# Patient Record
Sex: Male | Born: 1960 | Hispanic: No | Marital: Married | Smoking: Never smoker
Health system: Southern US, Community
[De-identification: ages and names within clinical notes are randomized; demographics above are authoritative.]

## PROBLEM LIST (undated history)

## (undated) DIAGNOSIS — N281 Cyst of kidney, acquired: Secondary | ICD-10-CM

## (undated) DIAGNOSIS — N3289 Other specified disorders of bladder: Secondary | ICD-10-CM

## (undated) DIAGNOSIS — N4289 Other specified disorders of prostate: Secondary | ICD-10-CM

## (undated) DIAGNOSIS — K760 Fatty (change of) liver, not elsewhere classified: Secondary | ICD-10-CM

## (undated) DIAGNOSIS — S199XXA Unspecified injury of neck, initial encounter: Secondary | ICD-10-CM

## (undated) DIAGNOSIS — Z87448 Personal history of other diseases of urinary system: Secondary | ICD-10-CM

---

## 1997-08-06 ENCOUNTER — Emergency Department (HOSPITAL_COMMUNITY): Admission: EM | Admit: 1997-08-06 | Discharge: 1997-08-06 | Payer: Self-pay | Admitting: Emergency Medicine

## 1997-08-10 ENCOUNTER — Encounter: Admission: RE | Admit: 1997-08-10 | Discharge: 1997-08-10 | Payer: Self-pay | Admitting: Hematology and Oncology

## 1997-08-20 ENCOUNTER — Emergency Department (HOSPITAL_COMMUNITY): Admission: EM | Admit: 1997-08-20 | Discharge: 1997-08-20 | Payer: Self-pay | Admitting: Emergency Medicine

## 1997-11-04 ENCOUNTER — Ambulatory Visit (HOSPITAL_COMMUNITY): Admission: RE | Admit: 1997-11-04 | Discharge: 1997-11-04 | Payer: Self-pay | Admitting: Family Medicine

## 1997-11-04 ENCOUNTER — Encounter: Payer: Self-pay | Admitting: Family Medicine

## 1997-11-29 ENCOUNTER — Ambulatory Visit (HOSPITAL_COMMUNITY): Admission: RE | Admit: 1997-11-29 | Discharge: 1997-11-29 | Payer: Self-pay | Admitting: Internal Medicine

## 1998-02-26 DIAGNOSIS — S199XXA Unspecified injury of neck, initial encounter: Secondary | ICD-10-CM

## 1998-02-26 HISTORY — DX: Unspecified injury of neck, initial encounter: S19.9XXA

## 1999-02-27 DIAGNOSIS — Z87448 Personal history of other diseases of urinary system: Secondary | ICD-10-CM

## 1999-02-27 HISTORY — DX: Personal history of other diseases of urinary system: Z87.448

## 1999-06-06 ENCOUNTER — Encounter: Payer: Self-pay | Admitting: Family Medicine

## 1999-06-06 ENCOUNTER — Encounter: Admission: RE | Admit: 1999-06-06 | Discharge: 1999-06-06 | Payer: Self-pay | Admitting: Family Medicine

## 1999-06-22 ENCOUNTER — Encounter: Admission: RE | Admit: 1999-06-22 | Discharge: 1999-06-22 | Payer: Self-pay | Admitting: Urology

## 1999-06-22 ENCOUNTER — Encounter: Payer: Self-pay | Admitting: Urology

## 2000-02-15 ENCOUNTER — Other Ambulatory Visit: Admission: RE | Admit: 2000-02-15 | Discharge: 2000-02-15 | Payer: Self-pay | Admitting: Urology

## 2000-09-11 ENCOUNTER — Other Ambulatory Visit: Admission: RE | Admit: 2000-09-11 | Discharge: 2000-09-11 | Payer: Self-pay | Admitting: Urology

## 2001-03-04 ENCOUNTER — Encounter: Payer: Self-pay | Admitting: Emergency Medicine

## 2001-03-04 ENCOUNTER — Emergency Department (HOSPITAL_COMMUNITY): Admission: EM | Admit: 2001-03-04 | Discharge: 2001-03-04 | Payer: Self-pay | Admitting: Emergency Medicine

## 2001-04-09 ENCOUNTER — Emergency Department (HOSPITAL_COMMUNITY): Admission: EM | Admit: 2001-04-09 | Discharge: 2001-04-09 | Payer: Self-pay | Admitting: Emergency Medicine

## 2003-02-27 HISTORY — PX: NECK SURGERY: SHX720

## 2007-05-13 ENCOUNTER — Emergency Department (HOSPITAL_COMMUNITY): Admission: EM | Admit: 2007-05-13 | Discharge: 2007-05-13 | Payer: Self-pay | Admitting: Emergency Medicine

## 2009-05-30 ENCOUNTER — Emergency Department (HOSPITAL_COMMUNITY): Admission: EM | Admit: 2009-05-30 | Discharge: 2009-05-31 | Payer: Self-pay | Admitting: Emergency Medicine

## 2010-08-22 ENCOUNTER — Emergency Department (HOSPITAL_COMMUNITY)
Admission: EM | Admit: 2010-08-22 | Discharge: 2010-08-22 | Disposition: A | Discharge status: Home / Self Care | Payer: 59 | Attending: Emergency Medicine | Admitting: Emergency Medicine

## 2010-08-22 DIAGNOSIS — T622X1A Toxic effect of other ingested (parts of) plant(s), accidental (unintentional), initial encounter: Secondary | ICD-10-CM | POA: Insufficient documentation

## 2010-08-22 DIAGNOSIS — L255 Unspecified contact dermatitis due to plants, except food: Secondary | ICD-10-CM | POA: Insufficient documentation

## 2010-08-22 DIAGNOSIS — L299 Pruritus, unspecified: Secondary | ICD-10-CM | POA: Insufficient documentation

## 2011-05-25 ENCOUNTER — Emergency Department (INDEPENDENT_AMBULATORY_CARE_PROVIDER_SITE_OTHER): Payer: Worker's Compensation

## 2011-05-25 ENCOUNTER — Emergency Department (HOSPITAL_BASED_OUTPATIENT_CLINIC_OR_DEPARTMENT_OTHER)
Admission: EM | Admit: 2011-05-25 | Discharge: 2011-05-25 | Disposition: A | Discharge status: Home / Self Care | Payer: Worker's Compensation | Attending: Emergency Medicine | Admitting: Emergency Medicine

## 2011-05-25 ENCOUNTER — Encounter (HOSPITAL_BASED_OUTPATIENT_CLINIC_OR_DEPARTMENT_OTHER): Payer: Self-pay | Admitting: *Deleted

## 2011-05-25 DIAGNOSIS — R209 Unspecified disturbances of skin sensation: Secondary | ICD-10-CM | POA: Insufficient documentation

## 2011-05-25 DIAGNOSIS — W208XXA Other cause of strike by thrown, projected or falling object, initial encounter: Secondary | ICD-10-CM | POA: Insufficient documentation

## 2011-05-25 DIAGNOSIS — S99929A Unspecified injury of unspecified foot, initial encounter: Secondary | ICD-10-CM | POA: Insufficient documentation

## 2011-05-25 DIAGNOSIS — S8012XA Contusion of left lower leg, initial encounter: Secondary | ICD-10-CM

## 2011-05-25 DIAGNOSIS — S8990XA Unspecified injury of unspecified lower leg, initial encounter: Secondary | ICD-10-CM | POA: Insufficient documentation

## 2011-05-25 DIAGNOSIS — S8010XA Contusion of unspecified lower leg, initial encounter: Secondary | ICD-10-CM | POA: Insufficient documentation

## 2011-05-25 DIAGNOSIS — M79609 Pain in unspecified limb: Secondary | ICD-10-CM

## 2011-05-25 MED ORDER — NAPROXEN 500 MG PO TABS: 500.0000 mg | ORAL_TABLET | Freq: Two times a day (BID) | ORAL | Status: AC

## 2011-05-25 MED ORDER — NAPROXEN 250 MG PO TABS
500.0000 mg | ORAL_TABLET | Freq: Once | ORAL | Status: AC
  Administered 2011-05-25: 500 mg via ORAL
  Filled 2011-05-25: qty 2

## 2011-05-25 NOTE — ED Notes (Signed)
Pt says he was working and some boxes (approx 10 boxes weighing approx 25lbs a piece fell from about 5 feet) fell on his left leg. He c/o pain in his left calf area

## 2011-05-25 NOTE — ED Provider Notes (Signed)
History     CSN: [id number]  Arrival date & time 05/25/11  0053   First MD Initiated Contact with Patient 05/25/11 0137      No chief complaint on file.   (Consider location/radiation/quality/duration/timing/severity/associated sxs/prior treatment) HPI Comments: 51 year old male presents after having acute onset of injury to his left lower leg when a pile of 25 pound boxes fell onto his lower leg off of a palate. The symptoms were acute in onset, persistent, worse with palpation or ambulation and associated with mild tingling of the left foot. He denies bruising, swelling or bleeding at the injury site. He has no other injury  The history is provided by the patient.    History reviewed. No pertinent past medical history.  History reviewed. No pertinent past surgical history.  No family history on file.  History  Substance Use Topics  . Smoking status: Never Smoker   . Smokeless tobacco: Not on file  . Alcohol Use: No      Review of Systems  Musculoskeletal: Negative for joint swelling.  Skin: Negative for wound.  Neurological: Negative for numbness.       Tingling    Allergies  Review of patient's allergies indicates no known allergies.  Home Medications   Current Outpatient Rx  Name Route Sig Dispense Refill  . NAPROXEN 500 MG PO TABS Oral Take 1 tablet (500 mg total) by mouth 2 (two) times daily with a meal. 30 tablet 0    BP 124/77  Pulse 84  Temp(Src) 97.7 F (36.5 C) (Oral)  Resp 20  SpO2 98%  Physical Exam  Nursing note and vitals reviewed. Constitutional: He appears well-developed and well-nourished. No distress.  HENT:  Head: Normocephalic and atraumatic.  Eyes: Conjunctivae are normal. No scleral icterus.  Cardiovascular: Normal rate, regular rhythm and intact distal pulses.   Pulmonary/Chest: Effort normal and breath sounds normal.  Musculoskeletal: He exhibits tenderness ( Over the mid lateral lower extremity below the knee and above the  ankle on the left.). He exhibits no edema.  Neurological: He is alert.       Normal motor and sensation of the left lower extremity  Skin: Skin is warm and dry. No rash noted. He is not diaphoretic.    ED Course  Procedures (including critical care time)  Labs Reviewed - No data to display Dg Tibia/fibula Left  05/25/2011  *RADIOLOGY REPORT*  Clinical Data: Boxes fell on left lower leg, with lateral lower leg pain.  LEFT TIBIA AND FIBULA - 2 VIEW  Comparison: None.  Findings: The tibia and fibula appear grossly intact.  There is no evidence of fracture or dislocation.  The knee joint is grossly unremarkable in appearance; no knee joint effusion is identified. The ankle mortise is incompletely assessed, but appears grossly unremarkable.  No significant soft tissue abnormalities are characterized on radiograph.  IMPRESSION: No evidence of fracture or dislocation.  Original Report Authenticated By: Tonia Ghent, M.D.     1. Contusion of left lower leg       MDM  No bruising or obvious deformity, likely contusion, x-ray to rule out fracture, Naprosyn for pain, ice and elevation. No other significant injuries evident on exam.  Imaging negative for fracture, I have personally reviewed the x-rays and find no injuries of the bones or underlying soft tissues. Patient stable for discharge, Naprosyn given prior to discharge  Discharge Prescriptions include:  Naprosyn        Vida Roller, MD 05/25/11 [phone]

## 2011-05-25 NOTE — Discharge Instructions (Signed)
See your doctor as needed - your xray is normal.  You may continue to have pain for the next week but should gradually improve.  RESOURCE GUIDE  Dental Problems  Patients with Medicaid: [facility] [phone] [address]                                           [phone] [address] Phone:  [phone]                                                  Phone:  [phone]  If unable to pay or uninsured, contact:  [facility] or [facility]. to become qualified for the adult dental clinic.  Chronic Pain Problems Contact [facility]  [phone] Patients need to be referred by their primary care doctor.  Insufficient Money for Medicine Contact [employer]:  call "211" or [facility] ([phone].  No Primary Care Doctor Call [facility]  [phone] Other agencies that provide inexpensive medical care    [facility]  [phone]    [facility]  [phone]    [facility]  [phone]    [facility]  [phone]    [facility]  [phone]    [facility]  [phone]  Psychological Services [facility] Behavioral Health  [phone] [facility] Services  [phone] [facility] Mental [facility]   [phone] (emergency services [phone])  Substance Abuse Resources [facility]  [phone] [facility] 7313724670 [facility] [place] [phone] Floydene Flock [phone] Residential & Outpatient Substance Abuse Program  [phone]  Abuse/Neglect [facility] Child Abuse Hotline 325 678 5652 [facility] Child Abuse Hotline 346-874-4249 (After Hours)  Emergency Shelter [facility] [phone]  Maternity Homes [facility] at [facility] [place] [facility] [place] [phone] [facility] [phone]  MRSA Hotline #:   551 392 8808    [facility] Resources  [facility] [place]      [employer]                          [facility] Dept. [address] [place]                       [address]      [address]  [place]                                                Cristobal Goldmann Phone:  [phone]                                   Phone:  [phone]  Phone:  [phone]  [facility] Mental Health Phone:  [phone]  [facility] Child Abuse Hotline [phone] (After Hours)

## 2017-05-26 ENCOUNTER — Emergency Department (HOSPITAL_COMMUNITY)
Admission: EM | Admit: 2017-05-26 | Discharge: 2017-05-26 | Disposition: A | Discharge status: Home / Self Care | Payer: 59 | Attending: Emergency Medicine | Admitting: Emergency Medicine

## 2017-05-26 ENCOUNTER — Other Ambulatory Visit: Payer: Self-pay

## 2017-05-26 ENCOUNTER — Encounter (HOSPITAL_COMMUNITY): Payer: Self-pay | Admitting: *Deleted

## 2017-05-26 ENCOUNTER — Emergency Department (HOSPITAL_COMMUNITY): Payer: 59

## 2017-05-26 DIAGNOSIS — N429 Disorder of prostate, unspecified: Secondary | ICD-10-CM | POA: Diagnosis not present

## 2017-05-26 DIAGNOSIS — N3289 Other specified disorders of bladder: Secondary | ICD-10-CM

## 2017-05-26 DIAGNOSIS — R319 Hematuria, unspecified: Secondary | ICD-10-CM

## 2017-05-26 DIAGNOSIS — N4289 Other specified disorders of prostate: Secondary | ICD-10-CM

## 2017-05-26 DIAGNOSIS — K760 Fatty (change of) liver, not elsewhere classified: Secondary | ICD-10-CM

## 2017-05-26 DIAGNOSIS — N281 Cyst of kidney, acquired: Secondary | ICD-10-CM

## 2017-05-26 DIAGNOSIS — R109 Unspecified abdominal pain: Secondary | ICD-10-CM | POA: Diagnosis present

## 2017-05-26 HISTORY — DX: Cyst of kidney, acquired: N28.1

## 2017-05-26 HISTORY — DX: Other specified disorders of prostate: N42.89

## 2017-05-26 HISTORY — DX: Fatty (change of) liver, not elsewhere classified: K76.0

## 2017-05-26 HISTORY — DX: Other specified disorders of bladder: N32.89

## 2017-05-26 LAB — URINALYSIS, ROUTINE W REFLEX MICROSCOPIC
BACTERIA UA: NONE SEEN
Bilirubin Urine: NEGATIVE
GLUCOSE, UA: NEGATIVE mg/dL
Ketones, ur: NEGATIVE mg/dL
Leukocytes, UA: NEGATIVE
Nitrite: NEGATIVE
PH: 6 (ref 5.0–8.0)
Protein, ur: NEGATIVE mg/dL
SPECIFIC GRAVITY, URINE: 1.009 (ref 1.005–1.030)
SQUAMOUS EPITHELIAL / LPF: NONE SEEN

## 2017-05-26 LAB — COMPREHENSIVE METABOLIC PANEL
ALBUMIN: 3.5 g/dL (ref 3.5–5.0)
ALT: 48 U/L (ref 17–63)
AST: 42 U/L — ABNORMAL HIGH (ref 15–41)
Alkaline Phosphatase: 84 U/L (ref 38–126)
Anion gap: 7 (ref 5–15)
BILIRUBIN TOTAL: 1.5 mg/dL — AB (ref 0.3–1.2)
BUN: 16 mg/dL (ref 6–20)
CO2: 28 mmol/L (ref 22–32)
CREATININE: 0.78 mg/dL (ref 0.61–1.24)
Calcium: 9.2 mg/dL (ref 8.9–10.3)
Chloride: 105 mmol/L (ref 101–111)
GFR calc Af Amer: >60 mL/min (ref >60)
GLUCOSE: 87 mg/dL (ref 65–99)
POTASSIUM: 4.2 mmol/L (ref 3.5–5.1)
Sodium: 140 mmol/L (ref 135–145)
TOTAL PROTEIN: 6.5 g/dL (ref 6.5–8.1)

## 2017-05-26 LAB — CBC
HEMATOCRIT: 44.5 % (ref 39.0–52.0)
Hemoglobin: 14.6 g/dL (ref 13.0–17.0)
MCH: 29.4 pg (ref 26.0–34.0)
MCHC: 32.8 g/dL (ref 30.0–36.0)
MCV: 89.5 fL (ref 78.0–100.0)
PLATELETS: 287 10*3/uL (ref 150–400)
RBC: 4.97 MIL/uL (ref 4.22–5.81)
RDW: 13.1 % (ref 11.5–15.5)
WBC: 6 10*3/uL (ref 4.0–10.5)

## 2017-05-26 LAB — LIPASE, BLOOD: Lipase: 36 U/L (ref 11–51)

## 2017-05-26 MED ORDER — IOPAMIDOL (ISOVUE-300) INJECTION 61%
INTRAVENOUS | Status: AC
  Administered 2017-05-26: 100 mL via INTRAVENOUS
  Filled 2017-05-26: qty 100

## 2017-05-26 NOTE — ED Triage Notes (Signed)
Pt is here with complaints of right flank pain that radiates around to mid abdomen for the last couple of days.  Pt complains on numbness to whole right side of his body for a long time. Pt denies NVD

## 2017-05-26 NOTE — ED Notes (Signed)
Called Pt twice, no answer from lobby area

## 2017-05-26 NOTE — Discharge Instructions (Addendum)
Your Ct scan shows a mass in your bladder and in your prostate.   Your urine shows blood.  You need to be seen by urology for complete evaluation

## 2017-05-26 NOTE — ED Notes (Signed)
Called pt's name for a room, no answer. Nurse notified.

## 2017-05-26 NOTE — ED Provider Notes (Signed)
[facility] EMERGENCY DEPARTMENT Provider Note   CSN: [id number] Arrival date & time: 05/26/17  1314     History   Chief Complaint Chief Complaint  Patient presents with  . Abdominal Pain  . Flank Pain    HPI Jeffrey Hurley is a 57 y.o. male.  The history is provided by the patient.  Abdominal Pain   This is a new problem. The current episode started more than 1 week ago. The problem occurs constantly. The problem has been gradually worsening. The pain is associated with an unknown factor. The pain is moderate. Pertinent negatives include anorexia and fever. Nothing aggravates the symptoms. Nothing relieves the symptoms.  Flank Pain  Associated symptoms include abdominal pain.   Pt complains of lower abdominal pain  History reviewed. No pertinent past medical history.  There are no active problems to display for this patient.   History reviewed. No pertinent surgical history.      Home Medications    Prior to Admission medications   Not on File    Family History No family history on file.  Social History Social History   Tobacco Use  . Smoking status: Never Smoker  . Smokeless tobacco: Never Used  Substance Use Topics  . Alcohol use: No  . Drug use: No     Allergies   Patient has no known allergies.   Review of Systems Review of Systems  Constitutional: Negative for fever.  Gastrointestinal: Positive for abdominal pain. Negative for anorexia.  Genitourinary: Positive for flank pain.  All other systems reviewed and are negative.    Physical Exam Updated Vital Signs BP 136/75 (BP Location: Right Arm)   Pulse 84   Temp 98.1 F (36.7 C) (Oral)   Resp 18   SpO2 99%   Physical Exam  Constitutional: He appears well-developed and well-nourished.  HENT:  Head: Normocephalic and atraumatic.  Mouth/Throat: Oropharynx is clear and moist.  Eyes: Pupils are equal, round, and reactive to light. Conjunctivae and EOM are normal.    Neck: Neck supple.  Cardiovascular: Normal rate and regular rhythm.  No murmur heard. Pulmonary/Chest: Effort normal and breath sounds normal. No respiratory distress.  Abdominal: Soft. Bowel sounds are normal. There is tenderness in the suprapubic area.  Musculoskeletal: He exhibits no edema.  Neurological: He is alert.  Skin: Skin is warm and dry.  Psychiatric: He has a normal mood and affect.  Nursing note and vitals reviewed.    ED Treatments / Results  Labs (all labs ordered are listed, but only abnormal results are displayed) Labs Reviewed  COMPREHENSIVE METABOLIC PANEL - Abnormal; Notable for the following components:      Result Value   AST 42 (*)    Total Bilirubin 1.5 (*)    All other components within normal limits  URINALYSIS, ROUTINE W REFLEX MICROSCOPIC - Abnormal; Notable for the following components:   Color, Urine STRAW (*)    Hgb urine dipstick MODERATE (*)    All other components within normal limits  LIPASE, BLOOD  CBC    EKG None  Radiology Ct Abdomen Pelvis W Contrast  Result Date: 05/26/2017 CLINICAL DATA:  Right flank pain radiating to the mid abdomen for the past few days. EXAM: CT ABDOMEN AND PELVIS WITH CONTRAST TECHNIQUE: Multidetector CT imaging of the abdomen and pelvis was performed using the standard protocol following bolus administration of intravenous contrast. CONTRAST:  156mL ISOVUE-300 IOPAMIDOL (ISOVUE-300) INJECTION 61% COMPARISON:  None. FINDINGS: Lower chest: Normal size heart  without pericardial effusion. There is atelectasis and/or scarring at the lung bases, right greater than left. Hepatobiliary: There is hepatic steatosis. Gallbladder is unremarkable. No biliary dilatation is noted. Pancreas: No ductal dilatation, inflammation or enhancing mass. Spleen: Normal Adrenals/Urinary Tract: Normal bilateral adrenal glands. Simple appearing cyst in the upper pole of the right kidney measuring 1 cm. Additional 6 mm cyst in the interpolar  right kidney is also noted. No nephrolithiasis nor obstructive uropathy. A 9 mm intraluminal rounded mass along the dependent wall of the bladder is visualized, series 3/66 and series 7/49 is noted for which direct visual correlation is recommended. This could represent a small bladder polyp, a mucosal/transitional cell mass, or intraluminal clot among some possibilities though not exclusive. Stomach/Bowel: Small hiatal hernia. Decompressed stomach with normal small bowel rotation. Moderate colonic stool burden without inflammation. Appendix appears normal. Vascular/Lymphatic: No significant vascular findings are present. No enlarged abdominal or pelvic lymph nodes. Reproductive: Approximately 2 cm rounded focus with peripheral enhancement and central hypodensity is noted within the anterior right side of the prostate. No pelvic sidewall involvement is identified. Other: No abdominal wall hernia or abnormality. No abdominopelvic ascites. Musculoskeletal: Moderate disc flattening at L3-4. IMPRESSION: 1. Intraluminal 9 mm mass along the dependent wall bladder which direct visual correlation is recommended. 2. Peripherally enhancing 2 cm intramural focus within the prostate also needs clinical correlation. From an imaging standpoint, non emergent prostate MRI may help for further evaluation. 3. Hepatic steatosis. 4. Moderate disc flattening L3-4. 5. Simple appearing cysts in the right kidney, the largest measuring 1 cm. Electronically Signed   By: Ashley Royalty M.D.   On: 05/26/2017 20:56    Procedures Procedures (including critical care time)  Medications Ordered in ED Medications  iopamidol (ISOVUE-300) 61 % injection (100 mLs Intravenous Contrast Given 05/26/17 1900)     Initial Impression / Assessment and Plan / ED Course  I have reviewed the triage vital signs and the nursing notes.  Pertinent labs & imaging results that were available during my care of the patient were reviewed by me and considered in  my medical decision making (see chart for details).     MDM  Pt counseled on results of ct scan.  Pt advised he needs to schedule to see the Urologist for evalaution of prostate and bladder abnormality.  [facility] to see [facility] for evaluation.  Pt is advised to call tomorrow for appointment   Final Clinical Impressions(s) / ED Diagnoses   Final diagnoses:  Abdominal pain, unspecified abdominal location  Bladder mass  Hematuria, unspecified type  Prostate mass    ED Discharge Orders    None    An After Visit Summary was printed and given to the patient.    Sidney Ace 05/26/17 2137    Valarie Merino, MD 05/26/17 [phone]

## 2017-11-04 ENCOUNTER — Other Ambulatory Visit: Payer: Self-pay | Admitting: Urology

## 2017-11-04 ENCOUNTER — Encounter (HOSPITAL_BASED_OUTPATIENT_CLINIC_OR_DEPARTMENT_OTHER): Payer: Self-pay

## 2017-11-05 ENCOUNTER — Encounter (HOSPITAL_BASED_OUTPATIENT_CLINIC_OR_DEPARTMENT_OTHER): Payer: Self-pay

## 2017-11-05 ENCOUNTER — Other Ambulatory Visit: Payer: Self-pay

## 2017-11-05 MED ORDER — GENTAMICIN SULFATE 40 MG/ML IJ SOLN
5.0000 mg/kg | INTRAVENOUS | Status: AC
  Administered 2017-11-06: 330 mg via INTRAVENOUS
  Filled 2017-11-05 (×2): qty 8.25

## 2017-11-05 NOTE — Anesthesia Preprocedure Evaluation (Addendum)
Anesthesia Evaluation  Patient identified by MRN, date of birth, ID band Patient awake    Reviewed: Allergy & Precautions, NPO status , Patient's Chart, lab work & pertinent test results  Airway Mallampati: II  TM Distance: >3 FB     Dental  (+) Dental Advisory Given, Teeth Intact, Missing,    Pulmonary neg pulmonary ROS,    breath sounds clear to auscultation       Cardiovascular negative cardio ROS   Rhythm:Regular Rate:Normal     Neuro/Psych negative neurological ROS     GI/Hepatic negative GI ROS, Neg liver ROS,   Endo/Other  negative endocrine ROS  Renal/GU negative Renal ROSBladder Tumor     Musculoskeletal   Abdominal   Peds  Hematology negative hematology ROS (+)   Anesthesia Other Findings   Reproductive/Obstetrics                          Lab Results  Component Value Date   WBC 6.0 05/26/2017   HGB 14.6 05/26/2017   HCT 44.5 05/26/2017   MCV 89.5 05/26/2017   PLT 287 05/26/2017   Lab Results  Component Value Date   CREATININE 0.78 05/26/2017   BUN 16 05/26/2017   NA 140 05/26/2017   K 4.2 05/26/2017   CL 105 05/26/2017   CO2 28 05/26/2017    Anesthesia Physical Anesthesia Plan  ASA: II  Anesthesia Plan: General   Post-op Pain Management:    Induction: Intravenous  PONV Risk Score and Plan: 2 and Dexamethasone and Ondansetron  Airway Management Planned: LMA and Oral ETT  Additional Equipment:   Intra-op Plan:   Post-operative Plan: Extubation in OR  Informed Consent: I have reviewed the patients History and Physical, chart, labs and discussed the procedure including the risks, benefits and alternatives for the proposed anesthesia with the patient or authorized representative who has indicated his/her understanding and acceptance.   Dental advisory given  Plan Discussed with: CRNA  Anesthesia Plan Comments:        Anesthesia Quick Evaluation

## 2017-11-05 NOTE — Progress Notes (Signed)
Spoke with:  Jeneen Rinks NPO:  After Midnight, no gum, candy, or mints  Arrival time:0800AM Labs: Istat 8 AM medications: None Pre op orders: Yes Ride home:  Donata Clay (wife) [phone]

## 2017-11-06 ENCOUNTER — Ambulatory Visit (HOSPITAL_BASED_OUTPATIENT_CLINIC_OR_DEPARTMENT_OTHER): Payer: 59 | Admitting: Anesthesiology

## 2017-11-06 ENCOUNTER — Encounter (HOSPITAL_BASED_OUTPATIENT_CLINIC_OR_DEPARTMENT_OTHER): Payer: Self-pay

## 2017-11-06 ENCOUNTER — Encounter (HOSPITAL_BASED_OUTPATIENT_CLINIC_OR_DEPARTMENT_OTHER)
Admission: RE | Disposition: A | Discharge status: Home / Self Care | Payer: Self-pay | Source: Ambulatory Visit | Attending: Urology

## 2017-11-06 ENCOUNTER — Ambulatory Visit (HOSPITAL_BASED_OUTPATIENT_CLINIC_OR_DEPARTMENT_OTHER)
Admission: RE | Admit: 2017-11-06 | Discharge: 2017-11-06 | Disposition: A | Discharge status: Home / Self Care | Payer: 59 | Source: Ambulatory Visit | Attending: Urology | Admitting: Urology

## 2017-11-06 DIAGNOSIS — N329 Bladder disorder, unspecified: Secondary | ICD-10-CM | POA: Diagnosis present

## 2017-11-06 DIAGNOSIS — D414 Neoplasm of uncertain behavior of bladder: Secondary | ICD-10-CM | POA: Insufficient documentation

## 2017-11-06 DIAGNOSIS — K760 Fatty (change of) liver, not elsewhere classified: Secondary | ICD-10-CM | POA: Diagnosis not present

## 2017-11-06 HISTORY — DX: Other specified disorders of prostate: N42.89

## 2017-11-06 HISTORY — DX: Personal history of other diseases of urinary system: Z87.448

## 2017-11-06 HISTORY — PX: CYSTOSCOPY W/ RETROGRADES: SHX1426

## 2017-11-06 HISTORY — DX: Cyst of kidney, acquired: N28.1

## 2017-11-06 HISTORY — DX: Unspecified injury of neck, initial encounter: S19.9XXA

## 2017-11-06 HISTORY — PX: TRANSURETHRAL RESECTION OF BLADDER TUMOR: SHX2575

## 2017-11-06 HISTORY — DX: Fatty (change of) liver, not elsewhere classified: K76.0

## 2017-11-06 HISTORY — DX: Other specified disorders of bladder: N32.89

## 2017-11-06 LAB — POCT I-STAT, CHEM 8
BUN: 12 mg/dL (ref 6–20)
CHLORIDE: 103 mmol/L (ref 98–111)
Calcium, Ion: 1.22 mmol/L (ref 1.15–1.40)
Creatinine, Ser: 0.8 mg/dL (ref 0.61–1.24)
Glucose, Bld: 86 mg/dL (ref 70–99)
HEMATOCRIT: 46 % (ref 39.0–52.0)
HEMOGLOBIN: 15.6 g/dL (ref 13.0–17.0)
Potassium: 3.9 mmol/L (ref 3.5–5.1)
SODIUM: 141 mmol/L (ref 135–145)
TCO2: 27 mmol/L (ref 22–32)

## 2017-11-06 SURGERY — TURBT (TRANSURETHRAL RESECTION OF BLADDER TUMOR)
Anesthesia: General | Site: Renal

## 2017-11-06 MED ORDER — LIDOCAINE 2% (20 MG/ML) 5 ML SYRINGE
INTRAMUSCULAR | Status: DC | PRN
  Administered 2017-11-06: 60 mg via INTRAVENOUS

## 2017-11-06 MED ORDER — TRAMADOL HCL 50 MG PO TABS
50.0000 mg | ORAL_TABLET | Freq: Once | ORAL | Status: AC
  Administered 2017-11-06: 50 mg via ORAL
  Filled 2017-11-06: qty 1

## 2017-11-06 MED ORDER — SENNOSIDES-DOCUSATE SODIUM 8.6-50 MG PO TABS: 1.0000 | ORAL_TABLET | Freq: Two times a day (BID) | ORAL | 0 refills | Status: DC

## 2017-11-06 MED ORDER — MIDAZOLAM HCL 2 MG/2ML IJ SOLN
INTRAMUSCULAR | Status: AC
  Filled 2017-11-06: qty 2

## 2017-11-06 MED ORDER — FENTANYL CITRATE (PF) 100 MCG/2ML IJ SOLN
INTRAMUSCULAR | Status: AC
  Filled 2017-11-06: qty 4

## 2017-11-06 MED ORDER — ONDANSETRON HCL 4 MG/2ML IJ SOLN
INTRAMUSCULAR | Status: DC | PRN
  Administered 2017-11-06: 4 mg via INTRAVENOUS

## 2017-11-06 MED ORDER — PROPOFOL 10 MG/ML IV BOLUS
INTRAVENOUS | Status: AC
  Filled 2017-11-06: qty 40

## 2017-11-06 MED ORDER — MIDAZOLAM HCL 2 MG/2ML IJ SOLN
INTRAMUSCULAR | Status: DC | PRN
  Administered 2017-11-06: 1 mg via INTRAVENOUS

## 2017-11-06 MED ORDER — PROPOFOL 10 MG/ML IV BOLUS
INTRAVENOUS | Status: DC | PRN
  Administered 2017-11-06: 150 mg via INTRAVENOUS

## 2017-11-06 MED ORDER — DEXAMETHASONE SODIUM PHOSPHATE 10 MG/ML IJ SOLN
INTRAMUSCULAR | Status: DC | PRN
  Administered 2017-11-06: 10 mg via INTRAVENOUS

## 2017-11-06 MED ORDER — SODIUM CHLORIDE 0.9 % IR SOLN
Status: DC | PRN
  Administered 2017-11-06: 3000 mL

## 2017-11-06 MED ORDER — FENTANYL CITRATE (PF) 100 MCG/2ML IJ SOLN
25.0000 ug | INTRAMUSCULAR | Status: DC | PRN
  Filled 2017-11-06: qty 1

## 2017-11-06 MED ORDER — FENTANYL CITRATE (PF) 100 MCG/2ML IJ SOLN
INTRAMUSCULAR | Status: DC | PRN
  Administered 2017-11-06: 25 ug via INTRAVENOUS

## 2017-11-06 MED ORDER — PROMETHAZINE HCL 25 MG/ML IJ SOLN
6.2500 mg | INTRAMUSCULAR | Status: DC | PRN
  Filled 2017-11-06: qty 1

## 2017-11-06 MED ORDER — FLUMAZENIL 0.5 MG/5ML IV SOLN
INTRAVENOUS | Status: DC | PRN
  Administered 2017-11-06: 0.2 mg via INTRAVENOUS

## 2017-11-06 MED ORDER — TRAMADOL HCL 50 MG PO TABS
ORAL_TABLET | ORAL | Status: AC
  Filled 2017-11-06: qty 1

## 2017-11-06 MED ORDER — TRAMADOL HCL 50 MG PO TABS: 50.0000 mg | ORAL_TABLET | Freq: Four times a day (QID) | ORAL | 0 refills | Status: AC | PRN

## 2017-11-06 MED ORDER — IOHEXOL 300 MG/ML  SOLN
INTRAMUSCULAR | Status: DC | PRN
  Administered 2017-11-06: 18 mL

## 2017-11-06 MED ORDER — LACTATED RINGERS IV SOLN
INTRAVENOUS | Status: DC
  Administered 2017-11-06: 09:00:00 via INTRAVENOUS
  Filled 2017-11-06: qty 1000

## 2017-11-06 MED ORDER — FLUMAZENIL 0.5 MG/5ML IV SOLN
INTRAVENOUS | Status: AC
  Filled 2017-11-06: qty 5

## 2017-11-06 SURGICAL SUPPLY — 30 items
BAG DRAIN URO-CYSTO SKYTR STRL (DRAIN) ×3 IMPLANT
BAG DRN ANRFLXCHMBR STRAP LEK (BAG)
BAG DRN UROCATH (DRAIN) ×2
BAG URINE DRAINAGE (UROLOGICAL SUPPLIES) IMPLANT
BAG URINE LEG 19OZ MD ST LTX (BAG) IMPLANT
BAG URINE LEG 500ML (DRAIN) IMPLANT
CATH FOLEY 2WAY SLVR  5CC 22FR (CATHETERS)
CATH FOLEY 2WAY SLVR 30CC 20FR (CATHETERS) IMPLANT
CATH FOLEY 2WAY SLVR 5CC 22FR (CATHETERS) IMPLANT
CATH INTERMIT  6FR 70CM (CATHETERS) ×3 IMPLANT
CLOTH BEACON ORANGE TIMEOUT ST (SAFETY) ×3 IMPLANT
ELECT REM PT RETURN 9FT ADLT (ELECTROSURGICAL)
ELECTRODE REM PT RTRN 9FT ADLT (ELECTROSURGICAL) ×2 IMPLANT
EVACUATOR MICROVAS BLADDER (UROLOGICAL SUPPLIES) IMPLANT
GLOVE BIO SURGEON STRL SZ7.5 (GLOVE) ×3 IMPLANT
GOWN STRL REUS W/ TWL LRG LVL3 (GOWN DISPOSABLE) ×2 IMPLANT
GOWN STRL REUS W/TWL LRG LVL3 (GOWN DISPOSABLE) ×6 IMPLANT
GUIDEWIRE ANG ZIPWIRE 038X150 (WIRE) IMPLANT
GUIDEWIRE STR DUAL SENSOR (WIRE) ×1 IMPLANT
IV NS 1000ML (IV SOLUTION)
IV NS 1000ML BAXH (IV SOLUTION) ×2 IMPLANT
IV NS IRRIG 3000ML ARTHROMATIC (IV SOLUTION) ×3 IMPLANT
KIT TURNOVER CYSTO (KITS) ×3 IMPLANT
LOOP CUT BIPOLAR 24F LRG (ELECTROSURGICAL) ×1 IMPLANT
MANIFOLD NEPTUNE II (INSTRUMENTS) ×3 IMPLANT
NS IRRIG 500ML POUR BTL (IV SOLUTION) ×3 IMPLANT
PACK CYSTO (CUSTOM PROCEDURE TRAY) ×3 IMPLANT
SYR 10ML LL (SYRINGE) IMPLANT
SYRINGE IRR TOOMEY STRL 70CC (SYRINGE) IMPLANT
TUBE CONNECTING 12X1/4 (SUCTIONS) IMPLANT

## 2017-11-06 NOTE — Anesthesia Postprocedure Evaluation (Signed)
Anesthesia Post Note  Patient: Jeffrey Hurley  Procedure(s) Performed: TRANSURETHRAL RESECTION OF BLADDER TUMOR (TURBT) (N/A Bladder) CYSTOSCOPY WITH RETROGRADE PYELOGRAM (Bilateral Renal)     Patient location during evaluation: PACU Anesthesia Type: General Level of consciousness: awake and alert Pain management: pain level controlled Vital Signs Assessment: post-procedure vital signs reviewed and stable Respiratory status: spontaneous breathing, nonlabored ventilation, respiratory function stable and patient connected to nasal cannula oxygen Cardiovascular status: blood pressure returned to baseline and stable Postop Assessment: no apparent nausea or vomiting Anesthetic complications: no    Last Vitals:  Vitals:   11/06/17 1145 11/06/17 1215  BP: 140/82 140/86  Pulse: (!) 58 (!) 59  Resp: 14 14  Temp:  (!) 36.3 C  SpO2: 100% 100%    Last Pain:  Vitals:   11/06/17 1215  TempSrc:   PainSc: 0-No pain                 Tiajuana Amass

## 2017-11-06 NOTE — Transfer of Care (Signed)
Immediate Anesthesia Transfer of Care Note  Patient: Jeffrey Hurley  Procedure(s) Performed: TRANSURETHRAL RESECTION OF BLADDER TUMOR (TURBT) (N/A Bladder) CYSTOSCOPY WITH RETROGRADE PYELOGRAM (Bilateral Renal)  Patient Location: PACU  Anesthesia Type:General  Level of Consciousness: awake, alert , oriented and patient cooperative  Airway & Oxygen Therapy: Patient Spontanous Breathing and Patient connected to nasal cannula oxygen  Post-op Assessment: Report given to RN and Post -op Vital signs reviewed and stable  Post vital signs: Reviewed and stable  Last Vitals:  Vitals Value Taken Time  BP 111/68 11/06/2017 10:38 AM  Temp    Pulse 51 11/06/2017 10:39 AM  Resp 13 11/06/2017 10:39 AM  SpO2 100 % 11/06/2017 10:39 AM  Vitals shown include unvalidated device data.  Last Pain:  Vitals:   11/06/17 0835  TempSrc:   PainSc: 0-No pain      Patients Stated Pain Goal: 5 (17/61/60 7371)  Complications: No apparent anesthesia complications

## 2017-11-06 NOTE — Op Note (Signed)
NAMEJOSEALBERTO, MONTALTO MEDICAL RECORD [id number] ACCOUNT [id number] DATE OF BIRTH:[birth year] FACILITY: [facility] LOCATION: [facility] PHYSICIAN:Chamar Broughton Tresa Moore, MD  OPERATIVE REPORT  DATE OF PROCEDURE:  11/06/2017  PREOPERATIVE DIAGNOSIS:  Bladder mass.  PROCEDURE:   1.  Transection of bladder tumor, volume medium. 2.  Bilateral pyelograms, interpretation.  ESTIMATED BLOOD LOSS:  Nil.  MEDICATIONS:  None.  SPECIMENS: 1.  Nodular bladder neck mass for permanent pathology. 2.  A deep margin of bladder neck mass, permanent pathology.  FINDINGS: 1.  Approximately 2.5 cm nodular bladder neck mass on a thin stalk in the midline, questionable urothelial versus prostate versus sternal origin. 2.  Complete resolution of bladder mass following transection. 3.  Unremarkable retrograde pyelograms.    INDICATIONS:  The patient is a very pleasant 57 year old gentleman who was found incidentally on imaging to have an enhancing bladder neck mass worrisome for possible neoplasm.  He underwent office cystoscopy which corroborated a nodular appearing mass  at the bladder neck in the midline.  This was well away from his ureteral orifices.  This did not have a typical papillary appearance.  Options were discussed including recommended path of transurethral resection for diagnosis, staging, and treatment  purposes.  He wished to proceed.  Informed consent was obtained and placed in medical record.  CONDITION OF PROCEDURE:  The patient was identified.  The procedure being transection of bladder tumor was confirmed.  Procedure timeout was performed.  Intravenous antibiotics administered, general LMA anesthesia induced.  The patient was placed into a  low lithotomy position, sterile field was created by prepping and draping base of the penis, perineum and proximal thighs using iodine.  Cystourethroscopy was performed using a 22-French rigid cystoscope with offset lens.  Inspection of the anterior and   posterior urethra was unremarkable.  Inspection of the urinary bladder revealed a 2.5 cm nodular mass in the area of the bladder neck.  This was on a thin stalk.  It did appear somewhat vascular.  It did not have the typical papillary appearance of  urothelial neoplasm.  Ureteral orifices were in normal anatomic position and appeared singleton.  The left ureteral orifice was cannulated with a 16-French Foley catheter and left retrograde pyelogram was obtained.  Left retrograde pyelogram demonstrated a single left ureter with single system left kidney.  No filling defects or narrowing noted.  Similarly, right retrograde pyelogram was obtained.  Right retrograde pyelogram demonstrated a single right ureter single system right kidney.  No filling defects or narrowing noted.  Next, a cystoscope was exchanged for a 26-French resectoscope sheath with visual obturator and using medium size  resectoscope loop, the bladder neck mass in question was resected at the level of the stalk, which released it free floating into the bladder.  This was then grasped with rigid graspers and removed in its entirety and set aside for pathologic analysis.   The deep margin of this was also resected with a single swipe of the resectoscope.  This set aside labeled as deep margin.  The base was fulgurated.  Following this, excellent hemostasis.  No evidence of bladder perforation.  Ureteral orifices remained  uninjured and visibly patent.  The bladder was partially emptied per cystoscope and procedure terminated.  The patient tolerated the procedure well.  No immediate complications.  The patient taken to the postanesthesia care in stable condition.  TN/NUANCE  D:11/06/2017 T:11/06/2017 JOB:[id number]

## 2017-11-06 NOTE — Anesthesia Procedure Notes (Signed)
Procedure Name: LMA Insertion Date/Time: 11/06/2017 9:59 AM Performed by: Wanita Chamberlain, CRNA Pre-anesthesia Checklist: Patient identified, Emergency Drugs available, Suction available, Patient being monitored and Timeout performed Patient Re-evaluated:Patient Re-evaluated prior to induction Oxygen Delivery Method: Circle system utilized Preoxygenation: Pre-oxygenation with 100% oxygen Induction Type: IV induction Ventilation: Mask ventilation without difficulty

## 2017-11-06 NOTE — Discharge Instructions (Signed)
1 - You may have urinary urgency (bladder spasms) and bloody urine on / off  For up to 2 weeks. This is normal.  2 - Call MD or go to ER for fever >102, severe pain / nausea / vomiting not relieved by medications, or acute change in medical status.   Post Anesthesia Home Care Instructions  Activity: Get plenty of rest for the remainder of the day. A responsible individual must stay with you for 24 hours following the procedure.  For the next 24 hours, DO NOT: -Drive a car -Paediatric nurse -Drink alcoholic beverages -Take any medication unless instructed by your physician -Make any legal decisions or sign important papers.  Meals: Start with liquid foods such as gelatin or soup. Progress to regular foods as tolerated. Avoid greasy, spicy, heavy foods. If nausea and/or vomiting occur, drink only clear liquids until the nausea and/or vomiting subsides. Call your physician if vomiting continues.  Special Instructions/Symptoms: Your throat may feel dry or sore from the anesthesia or the breathing tube placed in your throat during surgery. If this causes discomfort, gargle with warm salt water. The discomfort should disappear within 24 hours.

## 2017-11-06 NOTE — H&P (Signed)
Jeffrey Hurley is an 57 y.o. male.    Chief Complaint: Pre-Op Transurethral REsection fo bladder mass  HPI:   1 - Rule Out Bladder Cancer - 1cm ? trigone mass incidetnal on ER CT 04/2017. Non smoker. Cysto 05/2017 confirms abtou 1.5cm smooth (non-papillary) bladder neck / base of prostate mass.    PMH sig for throat surgyer after work injury. NO ischemic CV disease / blood thinners. He works in Loss adjuster, chartered, used to work in [employer]. His PCP is Jeffrey Finer MD.   Today "Jeffrey Hurley" is seen to proceed with cysto, retrogrades, TURBT for small bladder mass.    Past Medical History:  Diagnosis Date  . Bladder mass 05/26/2017   36mm  . Cyst of right kidney 05/26/2017   1cm, Noted on CT Abd  . Fatty liver 05/26/2017   Noted on CT Abd  . History of hematuria 2001  . Neck injury 2000   at work  . Prostate mass 05/26/2017   2 cm intramural focus within prostate, Noted on CT abd    Past Surgical History:  Procedure Laterality Date  . NECK SURGERY  2005    History reviewed. No pertinent family history. Social History:  reports that he has never smoked. He has never used smokeless tobacco. He reports that he does not drink alcohol or use drugs.  Allergies: No Known Allergies  No medications prior to admission.    No results found for this or any previous visit (from the past 48 hour(s)). No results found.  Review of Systems  Constitutional: Negative.   HENT: Negative.   Eyes: Negative.   Respiratory: Negative.   Cardiovascular: Negative.   Gastrointestinal: Negative.   Genitourinary: Negative.   Musculoskeletal: Negative.   Skin: Negative.   Neurological: Negative.   Endo/Heme/Allergies: Negative.   Psychiatric/Behavioral: Negative.     Blood pressure (!) 149/79, pulse 63, temperature (!) 97.4 F (36.3 C), temperature source Oral, resp. rate 18, height 5\' 10"  (1.778 m), weight 62.5 kg, SpO2 100 %. Physical Exam  Constitutional: He appears well-developed.  HENT:  Head:  Normocephalic.  Eyes: Pupils are equal, round, and reactive to light.  Cardiovascular: Normal rate.  Respiratory: Effort normal.  GI: Soft.  Genitourinary:  Genitourinary Comments: No CVAT at present.   Musculoskeletal: Normal range of motion.  Neurological: He is alert.  Skin: Skin is warm.  Psychiatric: He has a normal mood and affect.     Assessment/Plan  1 - Rule Out Bladder Cancer - proceed as planned with TURBT / Retrogrades. Risks, benefits, alternatives, expected peri-op course discussed previously and reiterated today.   Jeffrey Frock, MD 11/06/2017, 8:34 AM

## 2017-11-06 NOTE — Brief Op Note (Signed)
11/06/2017  10:21 AM  PATIENT:  Jeffrey Hurley  57 y.o. male  PRE-OPERATIVE DIAGNOSIS:  BLADDER MASS  POST-OPERATIVE DIAGNOSIS:  BLADDER MASS  PROCEDURE:  Procedure(s): TRANSURETHRAL RESECTION OF BLADDER TUMOR (TURBT) (N/A) CYSTOSCOPY WITH RETROGRADE PYELOGRAM (Bilateral)  SURGEON:  Surgeon(s) and Role:    * Alexis Frock, MD - Primary  PHYSICIAN ASSISTANT:   ASSISTANTS: none   ANESTHESIA:   general  EBL:  minimal   BLOOD ADMINISTERED:none  DRAINS: none   LOCAL MEDICATIONS USED:  NONE  SPECIMEN:  Source of Specimen:  1 - nodular bladder neck mass, 2 - base of mass  DISPOSITION OF SPECIMEN:  PATHOLOGY  COUNTS:  YES  TOURNIQUET:  * No tourniquets in log *  DICTATION: .Other Dictation: Dictation Number [id number]  PLAN OF CARE: Discharge to home after PACU  PATIENT DISPOSITION:  PACU - hemodynamically stable.   Delay start of Pharmacological VTE agent (>24hrs) due to surgical blood loss or risk of bleeding: yes

## 2017-11-07 ENCOUNTER — Encounter (HOSPITAL_BASED_OUTPATIENT_CLINIC_OR_DEPARTMENT_OTHER): Payer: Self-pay | Admitting: Urology

## 2019-01-19 ENCOUNTER — Ambulatory Visit (HOSPITAL_COMMUNITY)
Admission: EM | Admit: 2019-01-19 | Discharge: 2019-01-19 | Disposition: A | Discharge status: Home / Self Care | Payer: 59 | Attending: Family Medicine | Admitting: Family Medicine

## 2019-01-19 ENCOUNTER — Encounter (HOSPITAL_COMMUNITY): Payer: Self-pay | Admitting: Emergency Medicine

## 2019-01-19 ENCOUNTER — Other Ambulatory Visit: Payer: Self-pay

## 2019-01-19 DIAGNOSIS — M5432 Sciatica, left side: Secondary | ICD-10-CM | POA: Diagnosis not present

## 2019-01-19 MED ORDER — PREDNISONE 10 MG (21) PO TBPK: ORAL_TABLET | Freq: Every day | ORAL | 0 refills | Status: DC

## 2019-01-19 MED ORDER — DICLOFENAC SODIUM 75 MG PO TBEC: 75.0000 mg | DELAYED_RELEASE_TABLET | Freq: Two times a day (BID) | ORAL | 0 refills | Status: DC

## 2019-01-19 NOTE — ED Triage Notes (Signed)
Pt reports new onset left lower back pain that radiates into his hip and buttocks and all the way down to his left foot.  Pt states "the pain is electric."

## 2019-01-21 NOTE — ED Provider Notes (Signed)
[facility]   [id number] 01/19/19 Arrival Time: [id number]  ASSESSMENT & PLAN:  1. Sciatica of left side     Able to ambulate here and hemodynamically stable. No indication for imaging of back at this time given no trauma and normal neurological exam. Discussed.  Meds ordered this encounter  Medications  . diclofenac (VOLTAREN) 75 MG EC tablet    Sig: Take 1 tablet (75 mg total) by mouth 2 (two) times daily.    Dispense:  14 tablet    Refill:  0  . predniSONE (STERAPRED UNI-PAK 21 TAB) 10 MG (21) TBPK tablet    Sig: Take by mouth daily. Take as directed.    Dispense:  21 tablet    Refill:  0    Medication sedation precautions given. Encourage ROM/movement as tolerated.  Recommend: Follow-up Information    [facility].   Why: If worsening or failing to improve as anticipated. Contact information: [address] [place] [id number]          Reviewed expectations re: course of current medical issues. Questions answered. Outlined signs and symptoms indicating need for more acute intervention. Patient verbalized understanding. After Visit Summary given.   SUBJECTIVE: History from: patient.  Jeffrey Hurley is a 58 y.o. male who presents with complaint of intermittent left sided lower back/buttock discomfort. Onset gradual. First noted over this past week. Injury/trama: no. History of back problems requiring medical care: rare. Pain described as throbbing that sometimes radiates down his left leg, "like electricity". Pain is worse with prolonged walking/standing, unchanged with movements involving back, and unchanged with rest. Progressive LE weakness or saddle anesthesia: none. Extremity sensation changes or weakness: none. Ambulatory without difficulty. Normal bowel/bladder habits: yes; without urinary retention. Normal PO intake without n/v. No associated abdominal pain/n/v. Self treatment: has Tylenol  with mild help.  Reports no chronic steroid use, fevers, IV drug use, or recent back surgeries or procedures.  ROS: As per HPI. All other systems negative.   OBJECTIVE:  Vitals:   01/19/19 1307  BP: 119/67  Pulse: 67  Resp: 12  Temp: 98.3 F (36.8 C)  TempSrc: Oral  SpO2: 100%    General appearance: alert; no distress HEENT: [place]; AT Neck: supple with FROM; without midline tenderness CV: RRR Lungs: unlabored respirations; symmetrical air entry Abdomen: soft, non-tender; non-distended Back: poorly localized mild tenderness over L upper buttock; FROM at waist; bruising: none; without midline tenderness Extremities: without edema; symmetrical without gross deformities; normal ROM of bilateral LE Skin: warm and dry Neurologic: normal gait; normal reflexes of bilateral LE; normal sensation of bilateral LE; normal strength of bilateral LE Psychological: alert and cooperative; normal mood and affect    No Known Allergies  Past Medical History:  Diagnosis Date  . Bladder mass 05/26/2017   28mm  . Cyst of right kidney 05/26/2017   1cm, Noted on CT Abd  . Fatty liver 05/26/2017   Noted on CT Abd  . History of hematuria 2001  . Neck injury 2000   at work  . Prostate mass 05/26/2017   2 cm intramural focus within prostate, Noted on CT abd   Social History   Socioeconomic History  . Marital status: Married    Spouse name: Not on file  . Number of children: Not on file  . Years of education: Not on file  . Highest education level: Not on file  Occupational History  . Not on file  Social  Needs  . Financial resource strain: Not on file  . Food insecurity    Worry: Not on file    Inability: Not on file  . Transportation needs    Medical: Not on file    Non-medical: Not on file  Tobacco Use  . Smoking status: Never Smoker  . Smokeless tobacco: Never Used  Substance and Sexual Activity  . Alcohol use: No  . Drug use: No  . Sexual activity: Not on file  Lifestyle   . Physical activity    Days per week: Not on file    Minutes per session: Not on file  . Stress: Not on file  Relationships  . Social Herbalist on phone: Not on file    Gets together: Not on file    Attends religious service: Not on file    Active member of club or organization: Not on file    Attends meetings of clubs or organizations: Not on file    Relationship status: Not on file  . Intimate partner violence    Fear of current or ex partner: Not on file    Emotionally abused: Not on file    Physically abused: Not on file    Forced sexual activity: Not on file  Other Topics Concern  . Not on file  Social History Narrative  . Not on file   Family History  Family history unknown: Yes   Past Surgical History:  Procedure Laterality Date  . CYSTOSCOPY W/ RETROGRADES Bilateral 11/06/2017   Procedure: CYSTOSCOPY WITH RETROGRADE PYELOGRAM;  Surgeon: Alexis Frock, MD;  Location: [facility];  Service: Urology;  Laterality: Bilateral;  . NECK SURGERY  2005  . TRANSURETHRAL RESECTION OF BLADDER TUMOR N/A 11/06/2017   Procedure: TRANSURETHRAL RESECTION OF BLADDER TUMOR (TURBT);  Surgeon: Alexis Frock, MD;  Location: [facility];  Service: Urology;  Laterality: N/AVanessa Kick, MD 01/21/19 1009

## 2019-03-09 ENCOUNTER — Emergency Department (HOSPITAL_COMMUNITY): Payer: 59

## 2019-03-09 ENCOUNTER — Emergency Department (HOSPITAL_COMMUNITY)
Admission: EM | Admit: 2019-03-09 | Discharge: 2019-03-09 | Disposition: A | Discharge status: Home / Self Care | Payer: 59 | Attending: Emergency Medicine | Admitting: Emergency Medicine

## 2019-03-09 ENCOUNTER — Encounter (HOSPITAL_COMMUNITY): Payer: Self-pay

## 2019-03-09 ENCOUNTER — Other Ambulatory Visit: Payer: Self-pay

## 2019-03-09 DIAGNOSIS — R1030 Lower abdominal pain, unspecified: Secondary | ICD-10-CM | POA: Insufficient documentation

## 2019-03-09 LAB — COMPREHENSIVE METABOLIC PANEL
ALT: 37 U/L (ref 0–44)
AST: 31 U/L (ref 15–41)
Albumin: 4.3 g/dL (ref 3.5–5.0)
Alkaline Phosphatase: 71 U/L (ref 38–126)
Anion gap: 10 (ref 5–15)
BUN: 13 mg/dL (ref 6–20)
CO2: 25 mmol/L (ref 22–32)
Calcium: 9.5 mg/dL (ref 8.9–10.3)
Chloride: 101 mmol/L (ref 98–111)
Creatinine, Ser: 0.7 mg/dL (ref 0.61–1.24)
GFR calc Af Amer: >60 mL/min (ref >60)
GFR calc non Af Amer: >60 mL/min (ref >60)
Glucose, Bld: 81 mg/dL (ref 70–99)
Potassium: 3.6 mmol/L (ref 3.5–5.1)
Sodium: 136 mmol/L (ref 135–145)
Total Bilirubin: 2.1 mg/dL — ABNORMAL HIGH (ref 0.3–1.2)
Total Protein: 7.3 g/dL (ref 6.5–8.1)

## 2019-03-09 LAB — URINALYSIS, ROUTINE W REFLEX MICROSCOPIC
Bacteria, UA: NONE SEEN
Bilirubin Urine: NEGATIVE
Glucose, UA: NEGATIVE mg/dL
Ketones, ur: NEGATIVE mg/dL
Leukocytes,Ua: NEGATIVE
Nitrite: NEGATIVE
Protein, ur: NEGATIVE mg/dL
Specific Gravity, Urine: 1.011 (ref 1.005–1.030)
pH: 5 (ref 5.0–8.0)

## 2019-03-09 LAB — CBC
HCT: 48.2 % (ref 39.0–52.0)
Hemoglobin: 15.6 g/dL (ref 13.0–17.0)
MCH: 29.9 pg (ref 26.0–34.0)
MCHC: 32.4 g/dL (ref 30.0–36.0)
MCV: 92.3 fL (ref 80.0–100.0)
Platelets: 261 10*3/uL (ref 150–400)
RBC: 5.22 MIL/uL (ref 4.22–5.81)
RDW: 12.9 % (ref 11.5–15.5)
WBC: 6.9 10*3/uL (ref 4.0–10.5)
nRBC: 0 % (ref 0.0–0.2)

## 2019-03-09 LAB — LIPASE, BLOOD: Lipase: 23 U/L (ref 11–51)

## 2019-03-09 MED ORDER — IOHEXOL 300 MG/ML  SOLN
100.0000 mL | Freq: Once | INTRAMUSCULAR | Status: AC | PRN
  Administered 2019-03-09: 100 mL via INTRAVENOUS

## 2019-03-09 MED ORDER — SODIUM CHLORIDE (PF) 0.9 % IJ SOLN
INTRAMUSCULAR | Status: AC
  Filled 2019-03-09: qty 50

## 2019-03-09 MED ORDER — SODIUM CHLORIDE 0.9% FLUSH: 3.0000 mL | Freq: Once | INTRAVENOUS | Status: DC

## 2019-03-09 MED ORDER — ACETAMINOPHEN 500 MG PO TABS
1000.0000 mg | ORAL_TABLET | Freq: Once | ORAL | Status: AC
  Administered 2019-03-09: 1000 mg via ORAL
  Filled 2019-03-09: qty 2

## 2019-03-09 NOTE — ED Triage Notes (Signed)
Per EMS- Patient was at [facility] and was c/o generalized abdominal pain,but more so RLQ. Patient denies dysuria

## 2019-03-09 NOTE — ED Notes (Signed)
Patient had to go to the bathroom prior to triage. Patient given a urine specimen cup and instructed to collect a urine specimen.

## 2019-03-09 NOTE — ED Notes (Signed)
Pt a/ox4 at discharge and ambulatory. Pt has no other concerns and educated to follow up with PCP if pain continues. Pt called wife to come pick him up .

## 2019-03-09 NOTE — Discharge Instructions (Addendum)
It was our pleasure to provide your ER care today - we hope that you feel better.  Take ibuprofen or acetaminophen as need for pain.  Follow up with primary care doctor in next couple days for recheck if symptoms fail to improve/resolve.  Return to ER if worse, new symptoms, fevers, worsening or severe pain, persistent vomiting, or other concern.

## 2019-03-09 NOTE — ED Provider Notes (Addendum)
[facility] DEPT Provider Note   CSN: [id number] Arrival date & time: 03/09/19  1159     History Chief Complaint  Patient presents with  . Abdominal Pain    Jeffrey Hurley is a 59 y.o. male.  Patient c/o right lower abdominal pain acute onset this AM. Symptoms acute onset, moderate, dull, constant, persistent, non radiating. Nausea, and decreased appetite. No vomiting. Had normal bm today. No abd distension. No dysuria or hematuria. No scrotal or testicular pain. No fever or chills.   The history is provided by the patient.  Abdominal Pain Associated symptoms: no chest pain, no dysuria, no fever, no shortness of breath, no sore throat and no vomiting        Past Medical History:  Diagnosis Date  . Bladder mass 05/26/2017   73mm  . Cyst of right kidney 05/26/2017   1cm, Noted on CT Abd  . Fatty liver 05/26/2017   Noted on CT Abd  . History of hematuria 2001  . Neck injury 2000   at work  . Prostate mass 05/26/2017   2 cm intramural focus within prostate, Noted on CT abd    There are no problems to display for this patient.   Past Surgical History:  Procedure Laterality Date  . CYSTOSCOPY W/ RETROGRADES Bilateral 11/06/2017   Procedure: CYSTOSCOPY WITH RETROGRADE PYELOGRAM;  Surgeon: Alexis Frock, MD;  Location: [facility];  Service: Urology;  Laterality: Bilateral;  . NECK SURGERY  2005  . TRANSURETHRAL RESECTION OF BLADDER TUMOR N/A 11/06/2017   Procedure: TRANSURETHRAL RESECTION OF BLADDER TUMOR (TURBT);  Surgeon: Alexis Frock, MD;  Location: [facility];  Service: Urology;  Laterality: N/A;       Family History  Family history unknown: Yes    Social History   Tobacco Use  . Smoking status: Never Smoker  . Smokeless tobacco: Never Used  Substance Use Topics  . Alcohol use: No  . Drug use: No    Home Medications Prior to Admission medications   Medication Sig Start Date End Date Taking?  Authorizing Provider  diclofenac (VOLTAREN) 75 MG EC tablet Take 1 tablet (75 mg total) by mouth 2 (two) times daily. Patient taking differently: Take 75 mg by mouth daily.  01/19/19  Yes Hagler, Aaron Edelman, MD  predniSONE (STERAPRED UNI-PAK 21 TAB) 10 MG (21) TBPK tablet Take by mouth daily. Take as directed. Patient not taking: Reported on 03/09/2019 01/19/19   Vanessa Kick, MD  senna-docusate (SENOKOT-S) 8.6-50 MG tablet Take 1 tablet by mouth 2 (two) times daily. While taking pain meds to prevent constipation Patient not taking: Reported on 03/09/2019 11/06/17   Alexis Frock, MD    Allergies    Patient has no known allergies.  Review of Systems   Review of Systems  Constitutional: Negative for fever.  HENT: Negative for sore throat.   Eyes: Negative for redness.  Respiratory: Negative for shortness of breath.   Cardiovascular: Negative for chest pain.  Gastrointestinal: Positive for abdominal pain. Negative for vomiting.  Genitourinary: Negative for dysuria, flank pain and testicular pain.  Musculoskeletal: Negative for back pain and neck pain.  Skin: Negative for rash.  Neurological: Negative for headaches.  Hematological: Does not bruise/bleed easily.  Psychiatric/Behavioral: Negative for confusion.    Physical Exam Updated Vital Signs BP (!) 149/89 (BP Location: Left Arm)   Pulse 74   Temp (!) 97.5 F (36.4 C) (Oral)   Resp 18   Ht 1.803 m (5\' 11" )  Wt 67.1 kg   SpO2 100%   BMI 20.64 kg/m   Physical Exam Vitals and nursing note reviewed.  Constitutional:      Appearance: Normal appearance. He is well-developed.  HENT:     Head: Atraumatic.     Nose: Nose normal.     Mouth/Throat:     Mouth: Mucous membranes are moist.     Pharynx: Oropharynx is clear.  Eyes:     General: No scleral icterus.    Conjunctiva/sclera: Conjunctivae normal.  Neck:     Trachea: No tracheal deviation.  Cardiovascular:     Rate and Rhythm: Normal rate and regular rhythm.      Pulses: Normal pulses.     Heart sounds: Normal heart sounds. No murmur. No friction rub. No gallop.   Pulmonary:     Effort: Pulmonary effort is normal. No accessory muscle usage or respiratory distress.     Breath sounds: Normal breath sounds.  Abdominal:     General: Bowel sounds are normal. There is no distension.     Palpations: Abdomen is soft. There is no mass.     Tenderness: There is abdominal tenderness. There is no guarding or rebound.     Hernia: No hernia is present.     Comments: Right abd tenderness.  No incarc hernia.   Genitourinary:    Comments: No cva tenderness. Musculoskeletal:        General: No swelling or tenderness.     Cervical back: Normal range of motion and neck supple. No rigidity.     Right lower leg: No edema.     Left lower leg: No edema.  Skin:    General: Skin is warm and dry.     Findings: No rash.  Neurological:     Mental Status: He is alert.     Comments: Alert, speech clear.   Psychiatric:        Mood and Affect: Mood normal.     ED Results / Procedures / Treatments   Labs (all labs ordered are listed, but only abnormal results are displayed) Results for orders placed or performed during the hospital encounter of 03/09/19  Lipase, blood  Result Value Ref Range   Lipase 23 11 - 51 U/L  Comprehensive metabolic panel  Result Value Ref Range   Sodium 136 135 - 145 mmol/L   Potassium 3.6 3.5 - 5.1 mmol/L   Chloride 101 98 - 111 mmol/L   CO2 25 22 - 32 mmol/L   Glucose, Bld 81 70 - 99 mg/dL   BUN 13 6 - 20 mg/dL   Creatinine, Ser 0.70 0.61 - 1.24 mg/dL   Calcium 9.5 8.9 - 10.3 mg/dL   Total Protein 7.3 6.5 - 8.1 g/dL   Albumin 4.3 3.5 - 5.0 g/dL   AST 31 15 - 41 U/L   ALT 37 0 - 44 U/L   Alkaline Phosphatase 71 38 - 126 U/L   Total Bilirubin 2.1 (H) 0.3 - 1.2 mg/dL   GFR calc non Af Amer >60 >60 mL/min   GFR calc Af Amer >60 >60 mL/min   Anion gap 10 5 - 15  CBC  Result Value Ref Range   WBC 6.9 4.0 - 10.5 K/uL   RBC 5.22  4.22 - 5.81 MIL/uL   Hemoglobin 15.6 13.0 - 17.0 g/dL   HCT 48.2 39.0 - 52.0 %   MCV 92.3 80.0 - 100.0 fL   MCH 29.9 26.0 - 34.0 pg   MCHC 32.4  30.0 - 36.0 g/dL   RDW 12.9 11.5 - 15.5 %   Platelets 261 150 - 400 K/uL   nRBC 0.0 0.0 - 0.2 %  Urinalysis, Routine w reflex microscopic  Result Value Ref Range   Color, Urine YELLOW YELLOW   APPearance CLEAR CLEAR   Specific Gravity, Urine 1.011 1.005 - 1.030   pH 5.0 5.0 - 8.0   Glucose, UA NEGATIVE NEGATIVE mg/dL   Hgb urine dipstick SMALL (A) NEGATIVE   Bilirubin Urine NEGATIVE NEGATIVE   Ketones, ur NEGATIVE NEGATIVE mg/dL   Protein, ur NEGATIVE NEGATIVE mg/dL   Nitrite NEGATIVE NEGATIVE   Leukocytes,Ua NEGATIVE NEGATIVE   RBC / HPF 0-5 0 - 5 RBC/hpf   WBC, UA 0-5 0 - 5 WBC/hpf   Bacteria, UA NONE SEEN NONE SEEN   Mucus PRESENT     EKG None  Radiology CT Abdomen Pelvis W Contrast  Result Date: 03/09/2019 CLINICAL DATA:  Initial evaluation for acute right lower quadrant abdominal pain. EXAM: CT ABDOMEN AND PELVIS WITH CONTRAST TECHNIQUE: Multidetector CT imaging of the abdomen and pelvis was performed using the standard protocol following bolus administration of intravenous contrast. CONTRAST:  129mL OMNIPAQUE IOHEXOL 300 MG/ML  SOLN COMPARISON:  Prior CT from 05/27/2017. FINDINGS: Lower chest: Scattered subsegmental atelectatic changes seen within the lung bases dependently. Visualized lungs are otherwise clear. Hepatobiliary: Liver demonstrates a normal contrast enhanced appearance. Gallbladder within normal limits. No biliary dilatation. Pancreas: Pancreas within normal limits. Spleen: Spleen within normal limits. Adrenals/Urinary Tract: Adrenal glands within normal limits. Kidneys equal in size with symmetric enhancement. 1.3 cm simple cyst present at the upper pole of the right kidney. Additional subcentimeter hypodensities involving both kidneys are too small the characterize, but statistically likely reflect small cysts as well.  No nephrolithiasis or hydronephrosis. No focal enhancing renal mass. No visible hydroureter. Bladder moderately distended without acute abnormality. Stomach/Bowel: Stomach decompressed without acute abnormality. No evidence for bowel obstruction. Appendix not discretely identified, however, no acute inflammatory changes seen within the right lower quadrant to suggest acute appendicitis. Layering fluid noted within the distal colon, which could reflect underlying acute diarrheal illness. No other acute inflammatory changes seen about the bowels. Vascular/Lymphatic: Normal intravascular enhancement seen throughout the intra-abdominal aorta. Mesenteric vessels patent proximally. No adenopathy. Reproductive: Prostate within normal limits. Other: No free air or fluid. Musculoskeletal: No acute osseous abnormality. No worrisome lytic or blastic osseous lesions. IMPRESSION: 1. Layering fluid within the distal colon, which could reflect underlying acute diarrheal illness. 2. No other CT evidence for acute intra-abdominal or pelvic process. Electronically Signed   By: Jeannine Boga M.D.   On: 03/09/2019 22:53    Procedures Procedures (including critical care time)  Medications Ordered in ED Medications  sodium chloride flush (NS) 0.9 % injection 3 mL (has no administration in time range)    ED Course  I have reviewed the triage vital signs and the nursing notes.  Pertinent labs & imaging results that were available during my care of the patient were reviewed by me and considered in my medical decision making (see chart for details).    MDM Rules/Calculators/A&P                      Iv ns. Stat labs.  Reviewed nursing notes and prior charts for additional history.   Imaging ordered.  Labs reviewed/interpreted by me - chem normal. Wbc normal. UA with 0 rbc/wbc.   Imaging pending.  Acetaminophen po. Po fluids.  CT reviewed by me  - no acute appendicitis.   Recheck pt comfortable  appearing. No focal abd tenderness or peritoneal signs. Patient appears stable for d/c.       Final Clinical Impression(s) / ED Diagnoses Final diagnoses:  None    Rx / DC Orders ED Discharge Orders    None           Lajean Saver, MD 03/09/19 2304

## 2019-05-11 IMAGING — CT CT ABD-PELV W/ CM
2 of 5 series · 15 of 46 positions shown, 17 images · IV contrast (iopamidol)
Comparison: None.

CLINICAL DATA: Right flank pain radiating to the mid abdomen for
the past few days.

EXAM:
CT ABDOMEN AND PELVIS WITH CONTRAST
TECHNIQUE: Multidetector CT imaging of the abdomen and pelvis was performed
using the standard protocol following bolus administration of
intravenous contrast.
CONTRAST:  100mL ECK3GT-GUU IOPAMIDOL (ECK3GT-GUU) INJECTION 61%

[Series 3: abdomen 5.0 · axial · 0.66mm/px · z∈[+791,+1166]mm · 12 of 85 slices shown, 14 images]
[im 5/85  soft-tissue]
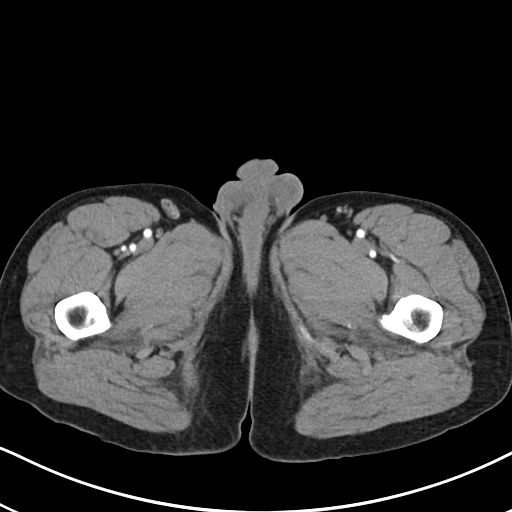
[im 5/85  bone]
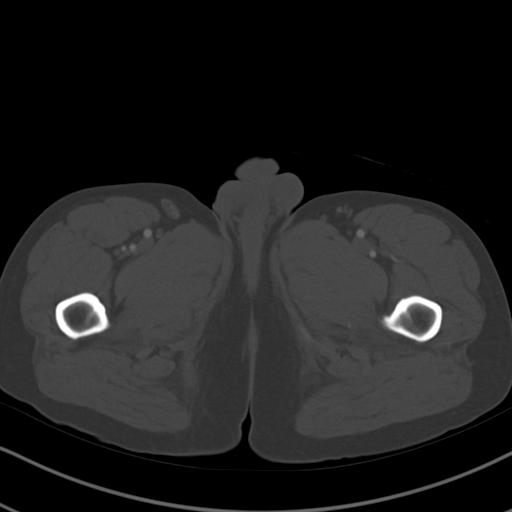
[im 15/85  soft-tissue]
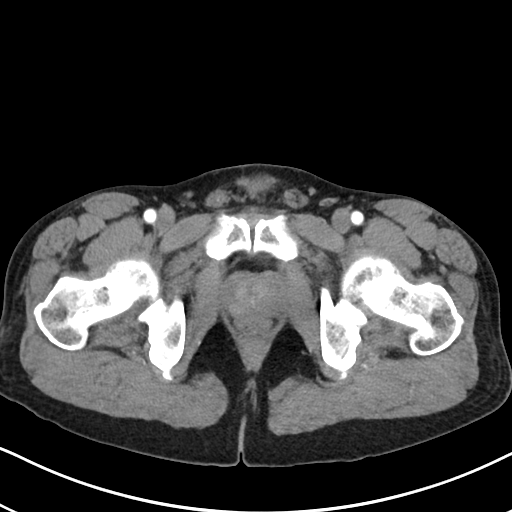
[im 20/85  soft-tissue]
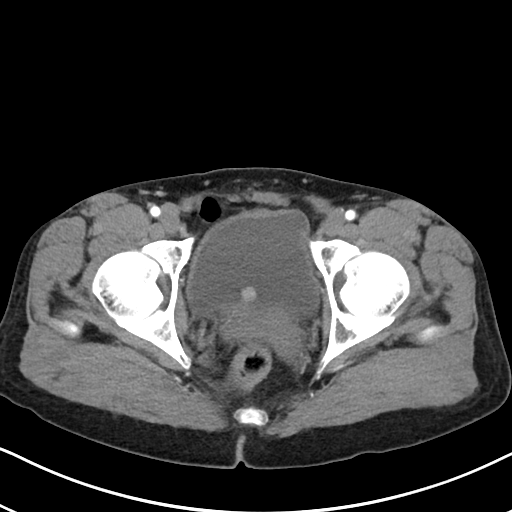
[im 25/85  soft-tissue]
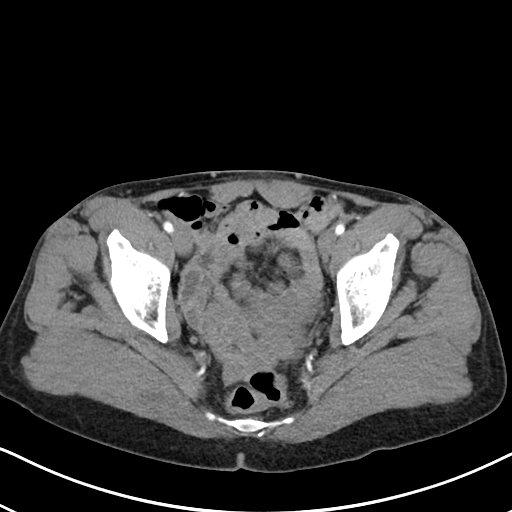
[im 35/85  soft-tissue]
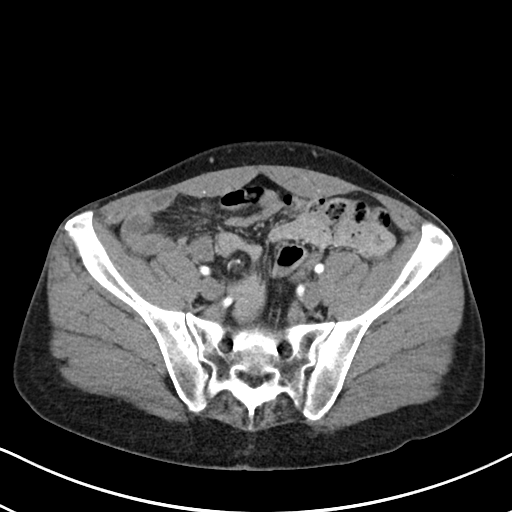
[im 40/85  soft-tissue]
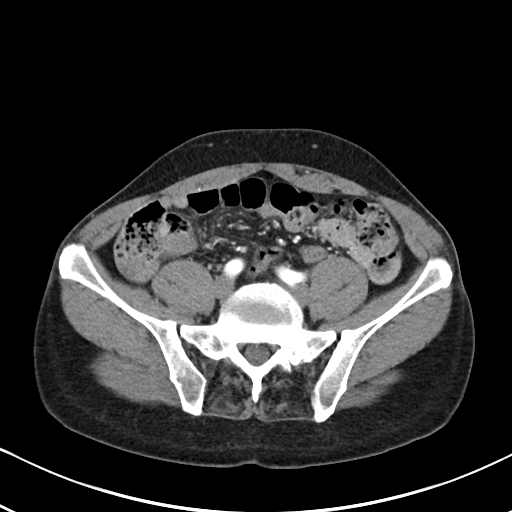
[im 45/85  soft-tissue]
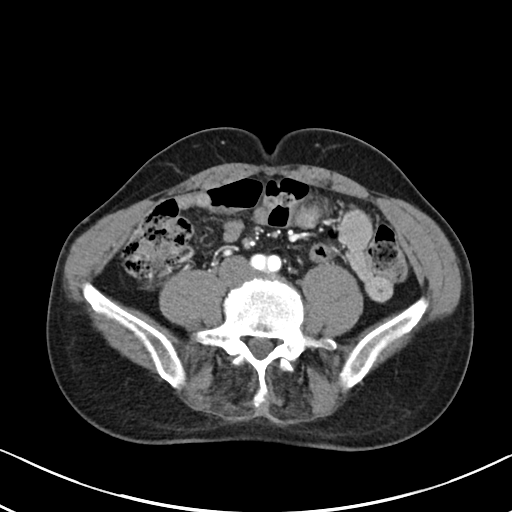
[im 55/85  soft-tissue]
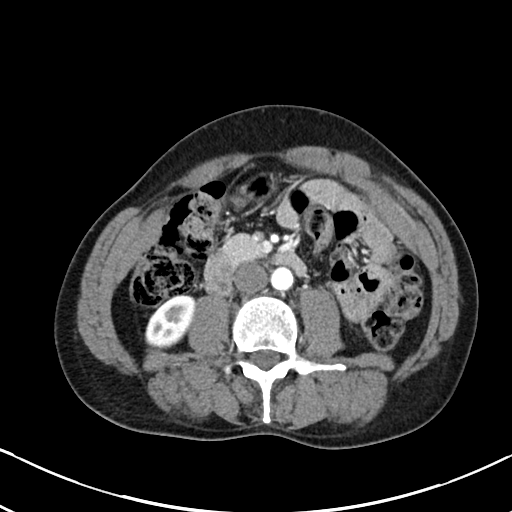
[im 60/85  soft-tissue]
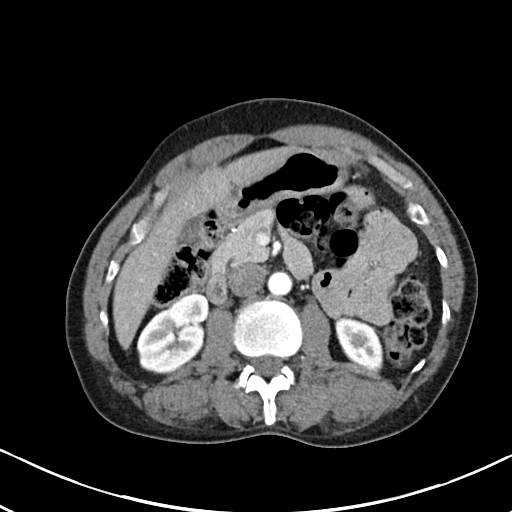
[im 60/85  bone]
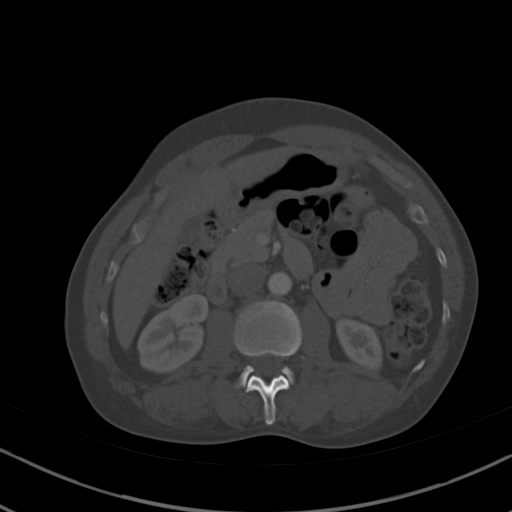
[im 65/85  soft-tissue]
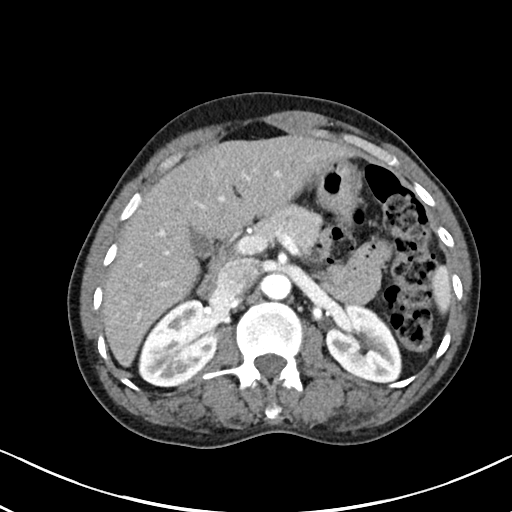
[im 75/85  soft-tissue]
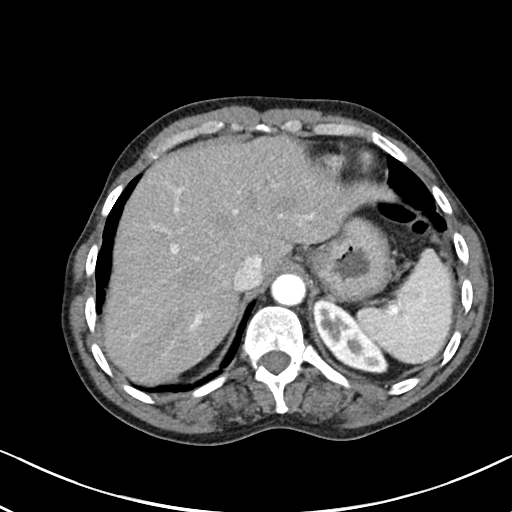
[im 80/85  soft-tissue]
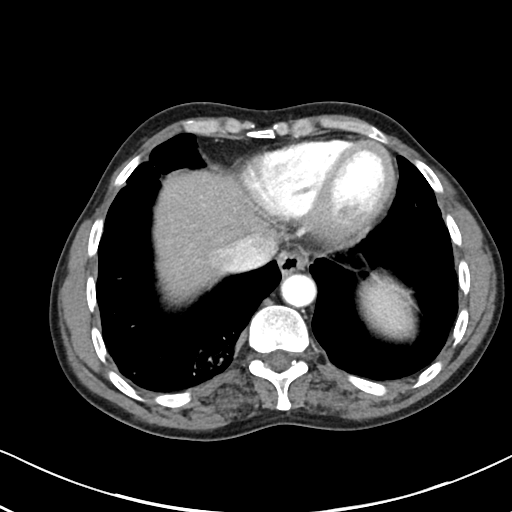

[Series 6: abdomen 3.0 mpr cor · coronal · 0.64mm/px · 3 of 101 slices shown]
[im 34/101  soft-tissue]
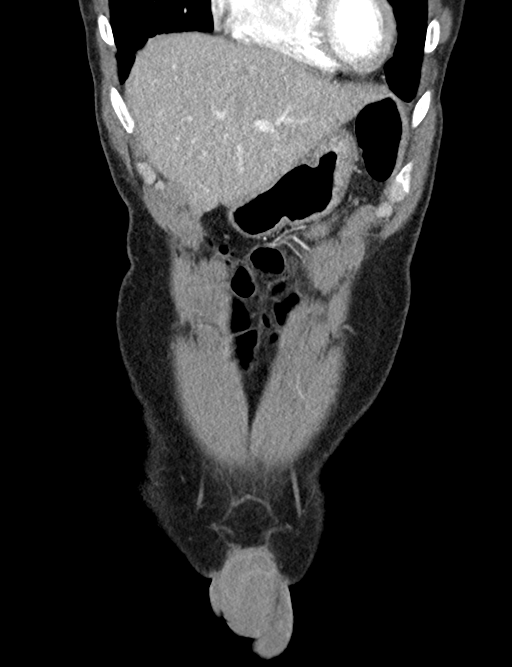
[im 45/101  soft-tissue]
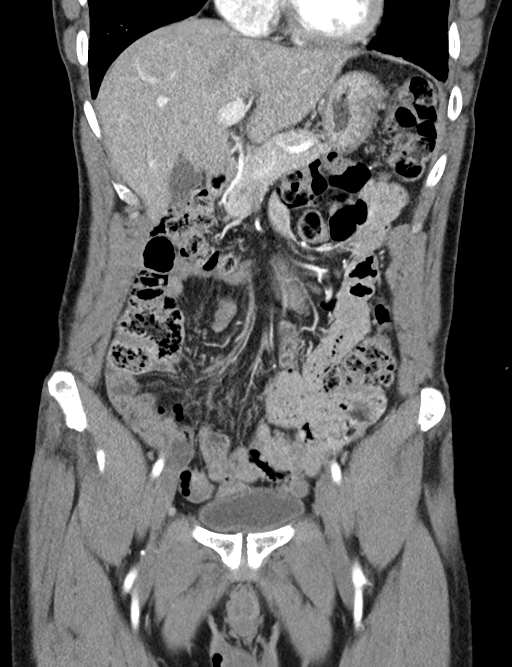
[im 56/101  soft-tissue]
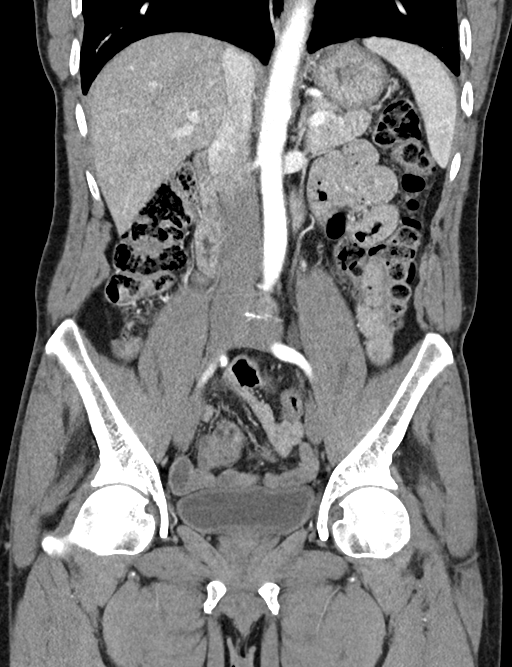

[15 of 46 positions shown; findings below may reference images not displayed]

FINDINGS: Lower chest: Normal size heart without pericardial effusion. There
is atelectasis and/or scarring at the lung bases, right greater than
left.

Hepatobiliary: There is hepatic steatosis. Gallbladder is
unremarkable. No biliary dilatation is noted.

Pancreas: No ductal dilatation, inflammation or enhancing mass.

Spleen: Normal

Adrenals/Urinary Tract: Normal bilateral adrenal glands. Simple
appearing cyst in the upper pole of the right kidney measuring 1 cm.
Additional 6 mm cyst in the interpolar right kidney is also noted.
No nephrolithiasis nor obstructive uropathy. A 9 mm intraluminal
rounded mass along the dependent wall of the bladder is visualized,
series [DATE] and series [DATE] is noted for which direct visual
correlation is recommended. This could represent a small bladder
polyp, a mucosal/transitional cell mass, or intraluminal clot among
some possibilities though not exclusive.

Stomach/Bowel: Small hiatal hernia. Decompressed stomach with normal
small bowel rotation. Moderate colonic stool burden without
inflammation. Appendix appears normal.

Vascular/Lymphatic: No significant vascular findings are present. No
enlarged abdominal or pelvic lymph nodes.

Reproductive: Approximately 2 cm rounded focus with peripheral
enhancement and central hypodensity is noted within the anterior
right side of the prostate. No pelvic sidewall involvement is
identified.

Other: No abdominal wall hernia or abnormality. No abdominopelvic
ascites.

Musculoskeletal: Moderate disc flattening at L3-4.
IMPRESSION: 1. Intraluminal 9 mm mass along the dependent wall bladder which
direct visual correlation is recommended.
2. Peripherally enhancing 2 cm intramural focus within the prostate
also needs clinical correlation. From an imaging standpoint, non
emergent prostate MRI may help for further evaluation.
3. Hepatic steatosis.
4. Moderate disc flattening L3-4.
5. Simple appearing cysts in the right kidney, the largest measuring
1 cm.

## 2020-09-12 ENCOUNTER — Other Ambulatory Visit: Payer: Self-pay

## 2020-09-12 ENCOUNTER — Ambulatory Visit
Admission: RE | Admit: 2020-09-12 | Discharge: 2020-09-12 | Disposition: A | Discharge status: Home / Self Care | Payer: 59 | Source: Ambulatory Visit | Attending: Physician Assistant | Admitting: Physician Assistant

## 2020-09-12 ENCOUNTER — Other Ambulatory Visit: Payer: Self-pay | Admitting: Physician Assistant

## 2020-09-12 DIAGNOSIS — R070 Pain in throat: Secondary | ICD-10-CM

## 2020-09-12 DIAGNOSIS — M542 Cervicalgia: Secondary | ICD-10-CM

## 2021-12-14 ENCOUNTER — Emergency Department (HOSPITAL_COMMUNITY): Payer: No Typology Code available for payment source

## 2021-12-14 ENCOUNTER — Encounter (HOSPITAL_COMMUNITY): Payer: Self-pay | Admitting: Emergency Medicine

## 2021-12-14 ENCOUNTER — Emergency Department (HOSPITAL_COMMUNITY)
Admission: EM | Admit: 2021-12-14 | Discharge: 2021-12-15 | Disposition: A | Discharge status: Home / Self Care | Payer: No Typology Code available for payment source | Attending: Student | Admitting: Student

## 2021-12-14 DIAGNOSIS — R519 Headache, unspecified: Secondary | ICD-10-CM | POA: Diagnosis not present

## 2021-12-14 DIAGNOSIS — W19XXXA Unspecified fall, initial encounter: Secondary | ICD-10-CM | POA: Insufficient documentation

## 2021-12-14 DIAGNOSIS — M79601 Pain in right arm: Secondary | ICD-10-CM | POA: Diagnosis not present

## 2021-12-14 DIAGNOSIS — S4991XA Unspecified injury of right shoulder and upper arm, initial encounter: Secondary | ICD-10-CM | POA: Insufficient documentation

## 2021-12-14 DIAGNOSIS — M542 Cervicalgia: Secondary | ICD-10-CM | POA: Diagnosis not present

## 2021-12-14 DIAGNOSIS — M79641 Pain in right hand: Secondary | ICD-10-CM | POA: Insufficient documentation

## 2021-12-14 DIAGNOSIS — M25511 Pain in right shoulder: Secondary | ICD-10-CM
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
## 2021-12-15 ENCOUNTER — Emergency Department (HOSPITAL_COMMUNITY): Payer: No Typology Code available for payment source

## 2021-12-15 MED ORDER — IBUPROFEN 800 MG PO TABS
800.0000 mg | ORAL_TABLET | Freq: Once | ORAL | Status: AC
  Administered 2021-12-15: 800 mg via ORAL
  Filled 2021-12-15: qty 1
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
## 2021-12-15 NOTE — Discharge Instructions (Addendum)
You were evaluated in the Emergency Department and after careful evaluation, we did not find any emergent condition requiring admission or further testing in the hospital.  Your work-up today was overall reassuring.  Your scans and x-rays did not show any acute findings.  As discussed, your CT scan did note a thyroid nodule which she will need to follow-up with your primary care doctor to get a nonemergent ultrasound.  Please continue to take Tylenol or ibuprofen for pain.  I did give you a referral to see an orthopedist, see Dr. Wandalee Ferdinand number in your discharge paperwork, call to schedule an appointment if you continue to have pain.  Please return to the Emergency Department if you experience any worsening of your condition.  We encourage you to follow up with a primary care provider.  Thank you for allowing [place] to be a part of your care.
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
## 2022-07-13 ENCOUNTER — Emergency Department (HOSPITAL_COMMUNITY)
Admission: EM | Admit: 2022-07-13 | Discharge: 2022-07-13 | Disposition: A | Discharge status: Home / Self Care | Payer: Medicare Other | Attending: Emergency Medicine | Admitting: Emergency Medicine

## 2022-07-13 ENCOUNTER — Other Ambulatory Visit: Payer: Self-pay

## 2022-07-13 ENCOUNTER — Emergency Department (HOSPITAL_COMMUNITY): Payer: Medicare Other

## 2022-07-13 DIAGNOSIS — S0993XA Unspecified injury of face, initial encounter: Secondary | ICD-10-CM

## 2022-07-13 DIAGNOSIS — S0990XA Unspecified injury of head, initial encounter: Secondary | ICD-10-CM | POA: Insufficient documentation

## 2022-07-13 DIAGNOSIS — H05222 Edema of left orbit: Secondary | ICD-10-CM | POA: Insufficient documentation

## 2022-07-13 LAB — CBC WITH DIFFERENTIAL/PLATELET
Abs Immature Granulocytes: 0.02 10*3/uL (ref 0.00–0.07)
Basophils Absolute: 0 10*3/uL (ref 0.0–0.1)
Basophils Relative: 0 %
Eosinophils Absolute: 0.3 10*3/uL (ref 0.0–0.5)
Eosinophils Relative: 3 %
HCT: 43.2 % (ref 39.0–52.0)
Hemoglobin: 14 g/dL (ref 13.0–17.0)
Immature Granulocytes: 0 %
Lymphocytes Relative: 23 %
Lymphs Abs: 2 10*3/uL (ref 0.7–4.0)
MCH: 28.7 pg (ref 26.0–34.0)
MCHC: 32.4 g/dL (ref 30.0–36.0)
MCV: 88.7 fL (ref 80.0–100.0)
Monocytes Absolute: 0.7 10*3/uL (ref 0.1–1.0)
Monocytes Relative: 9 %
Neutro Abs: 5.5 10*3/uL (ref 1.7–7.7)
Neutrophils Relative %: 65 %
Platelets: 305 10*3/uL (ref 150–400)
RBC: 4.87 MIL/uL (ref 4.22–5.81)
RDW: 13 % (ref 11.5–15.5)
WBC: 8.6 10*3/uL (ref 4.0–10.5)
nRBC: 0 % (ref 0.0–0.2)

## 2022-07-13 LAB — BASIC METABOLIC PANEL
Anion gap: 9 (ref 5–15)
BUN: 17 mg/dL (ref 8–23)
CO2: 24 mmol/L (ref 22–32)
Calcium: 9.1 mg/dL (ref 8.9–10.3)
Chloride: 105 mmol/L (ref 98–111)
Creatinine, Ser: 0.78 mg/dL (ref 0.61–1.24)
GFR, Estimated: >60 mL/min (ref >60)
Glucose, Bld: 90 mg/dL (ref 70–99)
Potassium: 3.7 mmol/L (ref 3.5–5.1)
Sodium: 138 mmol/L (ref 135–145)

## 2022-07-13 MED ORDER — MORPHINE SULFATE (PF) 4 MG/ML IV SOLN: 4.0000 mg | Freq: Once | INTRAVENOUS | Status: DC

## 2022-07-13 MED ORDER — HYDROCODONE-ACETAMINOPHEN 5-325 MG PO TABS
1.0000 | ORAL_TABLET | Freq: Once | ORAL | Status: AC
  Administered 2022-07-13: 1 via ORAL
  Filled 2022-07-13: qty 1
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
## 2022-07-13 NOTE — Discharge Instructions (Addendum)
It was a pleasure taking care of you today.  As discussed, your CT scans did not show any acute abnormalities.  You may take over-the-counter ibuprofen or Tylenol as needed for pain.  Please follow-up with PCP within the next few days for a recheck.  Return to the ER for new or worsening symptoms.
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
## 2022-10-04 ENCOUNTER — Ambulatory Visit: Payer: Medicare Other | Admitting: Podiatry

## 2022-10-12 ENCOUNTER — Ambulatory Visit (INDEPENDENT_AMBULATORY_CARE_PROVIDER_SITE_OTHER): Payer: Medicare Other | Admitting: Podiatry

## 2022-10-12 DIAGNOSIS — Z91199 Patient's noncompliance with other medical treatment and regimen due to unspecified reason: Secondary | ICD-10-CM

## 2022-10-12 NOTE — Progress Notes (Signed)
Pt was a no show for apt 

## 2023-01-17 ENCOUNTER — Other Ambulatory Visit: Payer: Self-pay

## 2023-01-17 ENCOUNTER — Emergency Department (HOSPITAL_COMMUNITY)
Admission: EM | Admit: 2023-01-17 | Discharge: 2023-01-17 | Disposition: A | Discharge status: Home / Self Care | Payer: Medicare Other | Attending: Emergency Medicine | Admitting: Emergency Medicine

## 2023-01-17 DIAGNOSIS — Z23 Encounter for immunization: Secondary | ICD-10-CM | POA: Insufficient documentation

## 2023-01-17 DIAGNOSIS — S0990XA Unspecified injury of head, initial encounter: Secondary | ICD-10-CM | POA: Diagnosis present

## 2023-01-17 DIAGNOSIS — S0181XA Laceration without foreign body of other part of head, initial encounter: Secondary | ICD-10-CM | POA: Diagnosis not present

## 2023-01-17 DIAGNOSIS — W268XXA Contact with other sharp object(s), not elsewhere classified, initial encounter: Secondary | ICD-10-CM | POA: Insufficient documentation

## 2023-01-17 MED ORDER — TETANUS-DIPHTH-ACELL PERTUSSIS 5-2.5-18.5 LF-MCG/0.5 IM SUSY
0.5000 mL | PREFILLED_SYRINGE | Freq: Once | INTRAMUSCULAR | Status: AC
  Administered 2023-01-17: 0.5 mL via INTRAMUSCULAR
  Filled 2023-01-17: qty 0.5

## 2023-01-17 MED ORDER — TETANUS-DIPHTHERIA TOXOIDS TD 5-2 LFU IM INJ: 0.5000 mL | INJECTION | Freq: Once | INTRAMUSCULAR | Status: DC
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
## 2023-01-22 ENCOUNTER — Encounter (HOSPITAL_COMMUNITY): Payer: Self-pay | Admitting: Emergency Medicine

## 2023-01-22 ENCOUNTER — Ambulatory Visit (HOSPITAL_COMMUNITY)
Admission: EM | Admit: 2023-01-22 | Discharge: 2023-01-22 | Disposition: A | Discharge status: Home / Self Care | Payer: Medicare Other | Attending: Family Medicine | Admitting: Family Medicine

## 2023-01-22 DIAGNOSIS — S0181XA Laceration without foreign body of other part of head, initial encounter: Secondary | ICD-10-CM

## 2023-01-22 MED ORDER — IBUPROFEN 600 MG PO TABS: 600.0000 mg | ORAL_TABLET | Freq: Three times a day (TID) | ORAL | 0 refills | Status: AC | PRN

## 2023-01-22 NOTE — ED Triage Notes (Signed)
Pt present to have wound on head checked. Injury happened last Thursday.

## 2023-01-22 NOTE — ED Provider Notes (Signed)
  [facility]   [id number] 01/22/23 Arrival Time: 1007  ASSESSMENT & PLAN:  1. Laceration of forehead, initial encounter    Steri strips removed. Wound looks good. Re-enforced with new steri-strips. Will let them loosen over the next sev days to weeks and remove at home. May return as needed.  Meds ordered this encounter  Medications   ibuprofen (ADVIL) 600 MG tablet    Sig: Take 1 tablet (600 mg total) by mouth every 8 (eight) hours as needed.    Dispense:  12 tablet    Refill:  0   Will follow up with PCP or here if worsening or failing to improve as anticipated. Reviewed expectations re: course of current medical issues. Questions answered. Outlined signs and symptoms indicating need for more acute intervention. Patient verbalized understanding. After Visit Summary given.   SUBJECTIVE:  Jeffrey Hurley is a 62 y.o. male who presents with a skin complaint. Seen in ED on 01/17/23; Dermabond and steri-strips applied to forehead laceration. Here for wound check. Reports area is a little sore.   OBJECTIVE: Vitals:   01/22/23 1104  BP: 122/72  Pulse: 73  Resp: 16  Temp: 97.9 F (36.6 C)  TempSrc: Oral  SpO2: 99%    General appearance: alert; no distress HEENT: mostly healed approx 1.5 cm linear L forehead laceration Psychological: alert and cooperative; normal mood and affect  No Known Allergies  Past Medical History:  Diagnosis Date   Bladder mass 05/26/2017   9mm   Cyst of right kidney 05/26/2017   1cm, Noted on CT Abd   Fatty liver 05/26/2017   Noted on CT Abd   History of hematuria 2001   Neck injury 2000   at work   Prostate mass 05/26/2017   2 cm intramural focus within prostate, Noted on CT abd   Social History   Socioeconomic History   Marital status: Married    Spouse name: Not on file   Number of children: Not on file   Years of education: Not on file   Highest education level: Not on file  Occupational History   Not on file   Tobacco Use   Smoking status: Never   Smokeless tobacco: Never  Vaping Use   Vaping status: Never Used  Substance and Sexual Activity   Alcohol use: No   Drug use: No   Sexual activity: Not on file  Other Topics Concern   Not on file  Social History Narrative   Not on file   Social Determinants of Health   Financial Resource Strain: Not on file  Food Insecurity: Not on file  Transportation Needs: Not on file  Physical Activity: Not on file  Stress: Not on file  Social Connections: Not on file  Intimate Partner Violence: Not on file   Family History  Family history unknown: Yes   Past Surgical History:  Procedure Laterality Date   CYSTOSCOPY W/ RETROGRADES Bilateral 11/06/2017   Procedure: CYSTOSCOPY WITH RETROGRADE PYELOGRAM;  Surgeon: Sebastian Ache, MD;  Location: [facility];  Service: Urology;  Laterality: Bilateral;   NECK SURGERY  2005   TRANSURETHRAL RESECTION OF BLADDER TUMOR N/A 11/06/2017   Procedure: TRANSURETHRAL RESECTION OF BLADDER TUMOR (TURBT);  Surgeon: Sebastian Ache, MD;  Location: [facility];  Service: Urology;  Laterality: N/AMardella Layman, MD 01/22/23 1432

## 2023-01-30 ENCOUNTER — Telehealth: Payer: Self-pay

## 2023-01-30 NOTE — Telephone Encounter (Signed)
No call back to reschedule Pre visit.  No Show Appointment.  Pre visit and colonoscopy has been cancelled. Letter sent to patient in the mail.

## 2023-01-30 NOTE — Telephone Encounter (Signed)
Patient No Show pre visit appointment at 1pm. Left 2 messages letting him know that I was trying to complete his previsit, and that he needed to call back and reschedule his pre visit by 5 pm today or his PV and colonoscopy will be cancelled.

## 2023-02-01 ENCOUNTER — Encounter: Payer: Self-pay | Admitting: Gastroenterology

## 2023-02-11 ENCOUNTER — Encounter: Payer: Medicare Other | Admitting: Gastroenterology

## 2023-03-07 ENCOUNTER — Ambulatory Visit (AMBULATORY_SURGERY_CENTER): Payer: Medicare Other

## 2023-03-07 VITALS — Ht 71.0 in | Wt 137.0 lb

## 2023-03-07 DIAGNOSIS — Z1211 Encounter for screening for malignant neoplasm of colon: Secondary | ICD-10-CM

## 2023-03-07 MED ORDER — NA SULFATE-K SULFATE-MG SULF 17.5-3.13-1.6 GM/177ML PO SOLN: 1.0000 | Freq: Once | ORAL | 0 refills | Status: AC

## 2023-03-07 NOTE — Progress Notes (Signed)

## 2023-03-19 ENCOUNTER — Encounter: Payer: Self-pay | Admitting: Gastroenterology

## 2023-03-20 ENCOUNTER — Ambulatory Visit (AMBULATORY_SURGERY_CENTER): Payer: Medicare Other | Admitting: Gastroenterology

## 2023-03-20 ENCOUNTER — Encounter: Payer: Self-pay | Admitting: Gastroenterology

## 2023-03-20 VITALS — BP 124/76 | HR 78 | Temp 98.9°F | Resp 17 | Ht 64.0 in | Wt 137.0 lb

## 2023-03-20 DIAGNOSIS — Q438 Other specified congenital malformations of intestine: Secondary | ICD-10-CM

## 2023-03-20 DIAGNOSIS — Z1211 Encounter for screening for malignant neoplasm of colon: Secondary | ICD-10-CM

## 2023-03-20 MED ORDER — SODIUM CHLORIDE 0.9 % IV SOLN
500.0000 mL | Freq: Once | INTRAVENOUS | Status: DC
Start: 2023-03-20 — End: 2023-03-20

## 2023-03-20 NOTE — Progress Notes (Unsigned)
 Pt A/O x 3, gd SR's, pleased with anesthesia, report to RN

## 2023-03-20 NOTE — Progress Notes (Signed)
Pt's states no medical or surgical changes since previsit or office visit. 

## 2023-03-20 NOTE — Patient Instructions (Addendum)
Resume previous diet.  Continue present medications.     YOU HAD AN ENDOSCOPIC PROCEDURE TODAY AT THE [facility]:   Refer to the procedure report that was given to you for any specific questions about what was found during the examination.  If the procedure report does not answer your questions, please call your gastroenterologist to clarify.  If you requested that your care partner not be given the details of your procedure findings, then the procedure report has been included in a sealed envelope for you to review at your convenience later.  YOU SHOULD EXPECT: Some feelings of bloating in the abdomen. Passage of more gas than usual.  Walking can help get rid of the air that was put into your GI tract during the procedure and reduce the bloating. If you had a lower endoscopy (such as a colonoscopy or flexible sigmoidoscopy) you may notice spotting of blood in your stool or on the toilet paper. If you underwent a bowel prep for your procedure, you may not have a normal bowel movement for a few days.  Please Note:  You might notice some irritation and congestion in your nose or some drainage.  This is from the oxygen used during your procedure.  There is no need for concern and it should clear up in a day or so.  SYMPTOMS TO REPORT IMMEDIATELY:  Following lower endoscopy (colonoscopy or flexible sigmoidoscopy):  Excessive amounts of blood in the stool  Significant tenderness or worsening of abdominal pains  Swelling of the abdomen that is new, acute  Fever of 100F or higher   For urgent or emergent issues, a gastroenterologist can be reached at any hour by calling [phone]. Do not use MyChart messaging for urgent concerns.    DIET:  We do recommend a small meal at first, but then you may proceed to your regular diet.  Drink plenty of fluids but you should avoid alcoholic beverages for 24 hours.  ACTIVITY:  You should plan to take it easy for the rest of today and you  should NOT DRIVE or use heavy machinery until tomorrow (because of the sedation medicines used during the test).    FOLLOW UP: Our staff will call the number listed on your records the next business day following your procedure.  We will call around 7:15- 8:00 am to check on you and address any questions or concerns that you may have regarding the information given to you following your procedure. If we do not reach you, we will leave a message.     If any biopsies were taken you will be contacted by phone or by letter within the next 1-3 weeks.  Please call us at [phone] if you have not heard about the biopsies in 3 weeks.    SIGNATURES/CONFIDENTIALITY: You and/or your care partner have signed paperwork which will be entered into your electronic medical record.  These signatures attest to the fact that that the information above on your After Visit Summary has been reviewed and is understood.  Full responsibility of the confidentiality of this discharge information lies with you and/or your care-partner.

## 2023-03-20 NOTE — Progress Notes (Unsigned)
History and Physical:  This patient presents for endoscopic testing for: Encounter Diagnosis  Name Primary?   Special screening for malignant neoplasms, colon Yes    Average risk for colorectal cancer.  1st screening exam.  Patient denies chronic abdominal pain, rectal bleeding, constipation or diarrhea.   Patient is otherwise without complaints or active issues today.   Past Medical History: Past Medical History:  Diagnosis Date   Bladder mass 05/26/2017   9mm   Cyst of right kidney 05/26/2017   1cm, Noted on CT Abd   Fatty liver 05/26/2017   Noted on CT Abd   History of hematuria 2001   Neck injury 2000   at work   Prostate mass 05/26/2017   2 cm intramural focus within prostate, Noted on CT abd     Past Surgical History: Past Surgical History:  Procedure Laterality Date   CYSTOSCOPY W/ RETROGRADES Bilateral 11/06/2017   Procedure: CYSTOSCOPY WITH RETROGRADE PYELOGRAM;  Surgeon: Sebastian Ache, MD;  Location: [facility];  Service: Urology;  Laterality: Bilateral;   NECK SURGERY  2005   TRANSURETHRAL RESECTION OF BLADDER TUMOR N/A 11/06/2017   Procedure: TRANSURETHRAL RESECTION OF BLADDER TUMOR (TURBT);  Surgeon: Sebastian Ache, MD;  Location: [facility];  Service: Urology;  Laterality: N/A;    Allergies: No Known Allergies  Outpatient Meds: Current Outpatient Medications  Medication Sig Dispense Refill   CLINPRO 5000 1.1 % PSTE Place onto teeth as directed.     gabapentin (NEURONTIN) 100 MG capsule Take 100 mg by mouth at bedtime. (Patient not taking: Reported on 03/20/2023)     HYDROcodone-acetaminophen (NORCO/VICODIN) 5-325 MG tablet  (Patient not taking: Reported on 03/20/2023)     ibuprofen (ADVIL) 600 MG tablet Take 1 tablet (600 mg total) by mouth every 8 (eight) hours as needed. (Patient not taking: Reported on 03/20/2023) 12 tablet 0   meloxicam (MOBIC) 15 MG tablet Take 1 tablet by mouth daily. (Patient not taking: Reported on  03/20/2023)     tiZANidine (ZANAFLEX) 4 MG tablet Take 4 mg by mouth at bedtime as needed. (Patient not taking: Reported on 03/20/2023)     Current Facility-Administered Medications  Medication Dose Route Frequency Provider Last Rate Last Admin   0.9 %  sodium chloride infusion  500 mL Intravenous Once Sherrilyn Rist, MD          ___________________________________________________________________ Objective   Exam:  BP 125/80   Pulse 75   Temp 98.9 F (37.2 C)   Ht 5\' 4"  (1.626 m)   Wt 137 lb (62.1 kg)   SpO2 99%   BMI 23.52 kg/m   CV: regular , S1/S2 Resp: clear to auscultation bilaterally, normal RR and effort noted GI: soft, no tenderness, with active bowel sounds.   Assessment: Encounter Diagnosis  Name Primary?   Special screening for malignant neoplasms, colon Yes     Plan: Colonoscopy   The benefits and risks of the planned procedure were described in detail with the patient or (when appropriate) their health care proxy.  Risks were outlined as including, but not limited to, bleeding, infection, perforation, adverse medication reaction leading to cardiac or pulmonary decompensation, pancreatitis (if ERCP).  The limitation of incomplete mucosal visualization was also discussed.  No guarantees or warranties were given.  The patient is appropriate for an endoscopic procedure in the ambulatory setting.   - Jeffrey Jupiter, MD

## 2023-03-20 NOTE — Op Note (Signed)
[facility] Patient Name: Jeffrey Hurley Procedure Date: 03/20/2023 3:57 PM MRN: [id number] Endoscopist: Sherilyn Cooter L. Myrtie Neither , MD, [id number] Age: 63 Referring MD:  Date of Birth: 12-03-1960 Gender: Male Account #: [id number] Procedure:                Colonoscopy Indications:              Screening for colorectal malignant neoplasm, This                            is the patient's first colonoscopy Medicines:                Monitored Anesthesia Care Procedure:                Pre-Anesthesia Assessment:                           - Prior to the procedure, a History and Physical                            was performed, and patient medications and                            allergies were reviewed. The patient's tolerance of                            previous anesthesia was also reviewed. The risks                            and benefits of the procedure and the sedation                            options and risks were discussed with the patient.                            All questions were answered, and informed consent                            was obtained. Prior Anticoagulants: The patient has                            taken no anticoagulant or antiplatelet agents. ASA                            Grade Assessment: II - A patient with mild systemic                            disease. After reviewing the risks and benefits,                            the patient was deemed in satisfactory condition to                            undergo the procedure.  After obtaining informed consent, the colonoscope                            was passed under direct vision. Throughout the                            procedure, the patient's blood pressure, pulse, and                            oxygen saturations were monitored continuously. The                            CF [id number] #[id number] was introduced through the anus                            and advanced to the the  cecum, identified by                            appendiceal orifice and ileocecal valve. The                            colonoscopy was somewhat difficult due to a                            redundant colon. Successful completion of the                            procedure was aided by using manual pressure and                            straightening and shortening the scope to obtain                            bowel loop reduction. The patient tolerated the                            procedure well. The quality of the bowel                            preparation was excellent. The ileocecal valve,                            appendiceal orifice, and rectum were photographed. Scope In: 4:06:58 PM Scope Out: 4:21:32 PM Scope Withdrawal Time: 0 hours 9 minutes 2 seconds  Total Procedure Duration: 0 hours 14 minutes 34 seconds  Findings:                 The perianal and digital rectal examinations were                            normal.                           Repeat examination of right colon under NBI  performed.                           The entire examined colon appeared normal on direct                            and retroflexion views. Complications:            No immediate complications. Estimated Blood Loss:     Estimated blood loss: none. Impression:               - The entire examined colon is normal on direct and                            retroflexion views.                           - No specimens collected. Recommendation:           - Patient has a contact number available for                            emergencies. The signs and symptoms of potential                            delayed complications were discussed with the                            patient. Return to normal activities tomorrow.                            Written discharge instructions were provided to the                            patient.                           - Resume  previous diet.                           - Continue present medications.                           - Repeat colonoscopy in 10 years for screening                            purposes. Annell Canty L. Myrtie Neither, MD 03/20/2023 4:27:08 PM This report has been signed electronically.

## 2023-03-21 ENCOUNTER — Telehealth: Payer: Self-pay | Admitting: *Deleted

## 2023-03-21 NOTE — Telephone Encounter (Signed)
  Follow up Call-     03/20/2023    3:17 PM  Call back number  Post procedure Call Back phone  # [phone]  Permission to leave phone message Yes     Patient questions:  Do you have a fever, pain , or abdominal swelling? No. Pain Score  0 *  Have you tolerated food without any problems? Yes.    Have you been able to return to your normal activities? Yes.    Do you have any questions about your discharge instructions: Diet   No. Medications  No. Follow up visit  No.  Do you have questions or concerns about your Care? No.  Actions: * If pain score is 4 or above: No action needed, pain <4.
